# Patient Record
Sex: Female | Born: 2000 | Race: White | Hispanic: No | Marital: Single | Smoking: Never smoker
Health system: Southern US, Community
[De-identification: ages and names within clinical notes are randomized; demographics above are authoritative.]

## PROBLEM LIST (undated history)

## (undated) DIAGNOSIS — F909 Attention-deficit hyperactivity disorder, unspecified type: Secondary | ICD-10-CM

## (undated) HISTORY — PX: NO PAST SURGERIES: SHX2092

## (undated) HISTORY — DX: Attention-deficit hyperactivity disorder, unspecified type: F90.9

---
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
## 2019-03-05 ENCOUNTER — Other Ambulatory Visit: Payer: Self-pay | Admitting: Family Medicine

## 2019-03-05 DIAGNOSIS — F900 Attention-deficit hyperactivity disorder, predominantly inattentive type: Secondary | ICD-10-CM

## 2019-03-26 ENCOUNTER — Other Ambulatory Visit: Payer: Self-pay | Admitting: Nurse Practitioner

## 2019-03-26 DIAGNOSIS — N946 Dysmenorrhea, unspecified: Secondary | ICD-10-CM

## 2019-03-28 ENCOUNTER — Other Ambulatory Visit: Payer: Self-pay | Admitting: Nurse Practitioner

## 2019-03-28 DIAGNOSIS — N946 Dysmenorrhea, unspecified: Secondary | ICD-10-CM

## 2019-04-27 ENCOUNTER — Other Ambulatory Visit: Payer: Self-pay | Admitting: Family Medicine

## 2019-04-27 DIAGNOSIS — F900 Attention-deficit hyperactivity disorder, predominantly inattentive type: Secondary | ICD-10-CM

## 2019-06-15 ENCOUNTER — Other Ambulatory Visit: Payer: Self-pay | Admitting: Family Medicine

## 2019-06-15 DIAGNOSIS — N946 Dysmenorrhea, unspecified: Secondary | ICD-10-CM

## 2019-07-01 ENCOUNTER — Other Ambulatory Visit: Payer: Self-pay | Admitting: Family Medicine

## 2019-07-01 DIAGNOSIS — F900 Attention-deficit hyperactivity disorder, predominantly inattentive type: Secondary | ICD-10-CM

## 2019-07-11 ENCOUNTER — Other Ambulatory Visit: Payer: Self-pay | Admitting: Family Medicine

## 2019-07-11 DIAGNOSIS — F900 Attention-deficit hyperactivity disorder, predominantly inattentive type: Secondary | ICD-10-CM

## 2019-07-11 NOTE — Telephone Encounter (Signed)
Requested medication (s) are due for refill today: yes  Requested medication (s) are on the active medication list: yes  Last refill: 04/27/19  needs appointment for refills  Future visit scheduled:No  Notes to clinic:  Attempted to contact patient at number on chart. Did not leave message. Person on machine was not the patient. Used a different name. Medication is not delegated.    Requested Prescriptions  Pending Prescriptions Disp Refills   amphetamine-dextroamphetamine (ADDERALL) 10 MG tablet [Pharmacy Med Name: AMPHETAMINE-DEXTROAMPHETAMINE 10 MG] 45 tablet     Sig: TAKE 1 TABLET EVERY DAY WITH BREAKFAST AND 1/2 TABLET EVERY AFTERNOON WITH FOOD      Not Delegated - Psychiatry:  Stimulants/ADHD Failed - 07/11/2019  4:33 PM      Failed - This refill cannot be delegated      Failed - Urine Drug Screen completed in last 360 days.      Failed - Valid encounter within last 3 months    Recent Outpatient Visits           10 months ago Attention deficit hyperactivity disorder (ADHD), predominantly inattentive type   [facility] Smitty Cords, DO   1 year ago Attention deficit hyperactivity disorder (ADHD), predominantly inattentive type   [facility] Galen Manila, NP   1 year ago Acute periumbilical pain   [facility] Smitty Cords, DO   1 year ago Attention deficit hyperactivity disorder (ADHD), predominantly inattentive type   [facility] Galen Manila, NP   2 years ago Dysmenorrhea   [facility] Rudd, Alison Stalling, NP

## 2019-09-14 ENCOUNTER — Encounter: Payer: Self-pay | Admitting: Family Medicine

## 2019-09-14 ENCOUNTER — Ambulatory Visit (INDEPENDENT_AMBULATORY_CARE_PROVIDER_SITE_OTHER): Payer: Self-pay | Admitting: Family Medicine

## 2019-09-14 ENCOUNTER — Other Ambulatory Visit: Payer: Self-pay

## 2019-09-14 DIAGNOSIS — F900 Attention-deficit hyperactivity disorder, predominantly inattentive type: Secondary | ICD-10-CM

## 2019-09-14 MED ORDER — AMPHETAMINE-DEXTROAMPHETAMINE 10 MG PO TABS: 10.0000 mg | ORAL_TABLET | Freq: Every day | ORAL | 0 refills | Status: DC

## 2019-09-14 NOTE — Assessment & Plan Note (Signed)
Presently unmedicated, but reports is stable when taking adderall 10mg  once daily.  Had taken the 5mg  in the afternoons, but has found that taking just the one dose has been adequate.  Plan: 1. Continue adderall 10mg  once daily. 2. Follow up in 3 months

## 2019-09-14 NOTE — Patient Instructions (Signed)
Your medication refills have been sent to your pharmacy on file.  We will plan to see you back in 3 months for follow up on your ADHD  You will receive a survey after today's visit either digitally by e-mail or paper by USPS mail. Your experiences and feedback matter to [place].  Please respond so we know how we are doing as we provide care for you.  Call us with any questions/concerns/needs.  It is my goal to be available to you for your health concerns.  Thanks for choosing me to be a partner in your healthcare needs!  Charlaine Dalton, FNP-C Family Nurse Practitioner [facility] Phone: [phone]

## 2019-09-14 NOTE — Progress Notes (Signed)
Subjective:    Patient ID: Kim Patterson, female    DOB: 25-Jul-2000, 19 y.o.   MRN: [id number]  Kim Patterson is a 19 y.o. female presenting on 09/14/2019 for ADHD (pt need medication refills )   HPI  Ms. Falk presents to clinic for medication refills.  Reports that she had been taking Adderall 10mg  with breakfast and 5mg  in the afternoons, has run out of her prescription but did find that she was seeing success with taking just 10mg  with breakfast.  Is interested in restarting medication.  Denies any other acute concerns today.  Depression screen [facility] 2/9 09/14/2019 02/09/2018  Decreased Interest 0 0  Down, Depressed, Hopeless 0 0  PHQ - 2 Score 0 0    Social History   Tobacco Use  . Smoking status: Never Smoker  . Smokeless tobacco: Never Used  Vaping Use  . Vaping Use: Never used  Substance Use Topics  . Alcohol use: No  . Drug use: No    Review of Systems  Constitutional: Negative.   HENT: Negative.   Eyes: Negative.   Respiratory: Negative.   Cardiovascular: Negative.   Gastrointestinal: Negative.   Endocrine: Negative.   Genitourinary: Negative.   Musculoskeletal: Negative.   Skin: Negative.   Allergic/Immunologic: Negative.   Neurological: Negative.   Hematological: Negative.   Psychiatric/Behavioral: Negative for agitation, behavioral problems, confusion, decreased concentration, dysphoric mood, hallucinations, self-injury, sleep disturbance and suicidal ideas. The patient is hyperactive. The patient is not nervous/anxious.    Per HPI unless specifically indicated above     Objective:    BP 124/86 (BP Location: Left Arm, Patient Position: Sitting, Cuff Size: Normal)   Pulse 72   Temp (!) 97.1 F (36.2 C) (Temporal)   Ht 5' 6.5" (1.689 m)   Wt 203 lb 9.6 oz (92.4 kg)   LMP 09/14/2019   BMI 32.37 kg/m   Wt Readings from Last 3 Encounters:  09/14/19 203 lb 9.6 oz (92.4 kg) (98 %, Z= 1.99)*  03/12/18 154 lb 6.4 oz (70 kg) (86 %, Z= 1.09)*  02/10/18 152  lb (68.9 kg) (85 %, Z= 1.03)*   * Growth percentiles are based on CDC (Girls, 2-20 Years) data.    Physical Exam Vitals reviewed.  Constitutional:      General: She is not in acute distress.    Appearance: Normal appearance. She is well-developed and well-groomed. She is obese. She is not ill-appearing or toxic-appearing.  HENT:     Head: Normocephalic and atraumatic.     Nose:     Comments: 09/16/19 is in place, covering mouth and nose. Eyes:     General: Lids are normal. Vision grossly intact.        Right eye: No discharge.        Left eye: No discharge.     Extraocular Movements: Extraocular movements intact.     Conjunctiva/sclera: Conjunctivae normal.     Pupils: Pupils are equal, round, and reactive to light.  Cardiovascular:     Rate and Rhythm: Normal rate and regular rhythm.     Pulses: Normal pulses.          Dorsalis pedis pulses are 2+ on the right side and 2+ on the left side.     Heart sounds: Normal heart sounds. No murmur heard.  No friction rub. No gallop.   Pulmonary:     Effort: Pulmonary effort is normal. No respiratory distress.     Breath sounds: Normal breath sounds.  Musculoskeletal:     Right lower leg: No edema.     Left lower leg: No edema.  Skin:    General: Skin is warm and dry.     Capillary Refill: Capillary refill takes less than 2 seconds.  Neurological:     General: No focal deficit present.     Mental Status: She is alert and oriented to person, place, and time.  Psychiatric:        Attention and Perception: Attention and perception normal.        Mood and Affect: Mood and affect normal.        Speech: Speech normal.        Behavior: Behavior is hyperactive. Behavior is cooperative.        Thought Content: Thought content normal.        Cognition and Memory: Cognition and memory normal.        Judgment: Judgment normal.    Results for orders placed or performed in visit on 02/09/18  CBC with Differential/Platelet  Result Value Ref  Range   WBC 14.4 (H) 4.5 - 13.0 Thousand/uL   RBC 5.13 (H) 3.80 - 5.10 Million/uL   Hemoglobin 16.0 (H) 11.5 - 15.3 g/dL   HCT 48.1 (H) 34 - 46 %   MCV 93.8 78.0 - 98.0 fL   MCH 31.2 25.0 - 35.0 pg   MCHC 33.3 31.0 - 36.0 g/dL   RDW 11.7 11.0 - 15.0 %   Platelets 509 (H) 140 - 400 Thousand/uL   MPV 10.7 7.5 - 12.5 fL   Neutro Abs 8,957 (H) 1,800 - 8,000 cells/uL   Lymphs Abs 3,456 1,200 - 5,200 cells/uL   WBC mixed population 936 (H) 200 - 900 cells/uL   Eosinophils Absolute 1,008 (H) 15 - 500 cells/uL   Basophils Absolute 43 0 - 200 cells/uL   Neutrophils Relative % 62.2 %   Total Lymphocyte 24.0 %   Monocytes Relative 6.5 %   Eosinophils Relative 7.0 %   Basophils Relative 0.3 %  COMPLETE METABOLIC PANEL WITH GFR  Result Value Ref Range   Glucose, Bld 104 (H) 65 - 99 mg/dL   BUN 9 7 - 20 mg/dL   Creat 0.84 0.50 - 1.00 mg/dL   GFR, Est Non African American 101 > OR = 60 mL/min/1.36m2   GFR, Est African American 118 > OR = 60 mL/min/1.20m2   BUN/Creatinine Ratio NOT APPLICABLE 6 - 22 (calc)   Sodium 137 135 - 146 mmol/L   Potassium 5.0 3.8 - 5.1 mmol/L   Chloride 101 98 - 110 mmol/L   CO2 28 20 - 32 mmol/L   Calcium 10.6 (H) 8.9 - 10.4 mg/dL   Total Protein 7.7 6.3 - 8.2 g/dL   Albumin 5.1 3.6 - 5.1 g/dL   Globulin 2.6 2.0 - 3.8 g/dL (calc)   AG Ratio 2.0 1.0 - 2.5 (calc)   Total Bilirubin 0.8 0.2 - 1.1 mg/dL   Alkaline phosphatase (APISO) 55 47 - 176 U/L   AST 14 12 - 32 U/L   ALT 17 5 - 32 U/L  POCT urinalysis dipstick  Result Value Ref Range   Color, UA clear    Clarity, UA clear    Glucose, UA Negative Negative   Bilirubin, UA neg    Ketones, UA neg    Spec Grav, UA 1.010 1.010 - 1.025   Blood, UA neg    pH, UA 5.0 5.0 - 8.0   Protein, UA Negative Negative  Urobilinogen, UA 0.2 0.2 or 1.0 E.U./dL   Nitrite, UA neg    Leukocytes, UA Negative Negative   Appearance normal    Odor neg   POCT urine pregnancy  Result Value Ref Range   Preg Test, Ur Negative  Negative      Assessment & Plan:   Problem List Items Addressed This Visit      Other   Attention deficit hyperactivity disorder (ADHD), predominantly inattentive type    Presently unmedicated, but reports is stable when taking adderall 10mg  once daily.  Had taken the 5mg  in the afternoons, but has found that taking just the one dose has been adequate.  Plan: 1. Continue adderall 10mg  once daily. 2. Follow up in 3 months      Relevant Medications   amphetamine-dextroamphetamine (ADDERALL) 10 MG tablet      Meds ordered this encounter  Medications  . amphetamine-dextroamphetamine (ADDERALL) 10 MG tablet    Sig: Take 1 tablet (10 mg total) by mouth daily with breakfast.    Dispense:  30 tablet    Refill:  0      Follow up plan: Return in about 3 months (around 12/15/2019) for ADHD F/U.   , FNP Family Nurse Practitioner [facility] 09/14/2019, 11:49 AM

## 2019-09-15 IMAGING — CT CT ABD-PELV W/ CM
2 of 4 series · 17 of 46 positions shown, 19 images · IV contrast (APPLIED)
Comparison: 08/14/2013

CLINICAL DATA: Intermittent RIGHT-sided abdominal pain with nausea
and vomiting.

EXAM:
CT ABDOMEN AND PELVIS WITH CONTRAST
TECHNIQUE: Multidetector CT imaging of the abdomen and pelvis was performed
using the standard protocol following bolus administration of
intravenous contrast.
CONTRAST:  100mL GKVGT5-Y88 IOPAMIDOL (GKVGT5-Y88) INJECTION 61%

[Series 2: routine abd/pel with · axial · 0.74mm/px · z∈[-504,-104]mm · 14 of 88 slices shown, 16 images]
[im 4/88  soft-tissue]
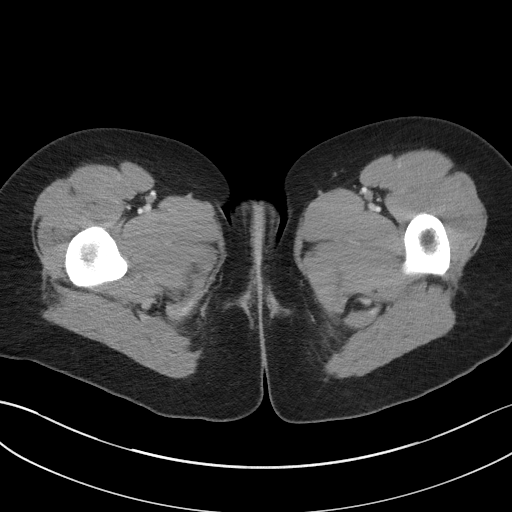
[im 4/88  bone]
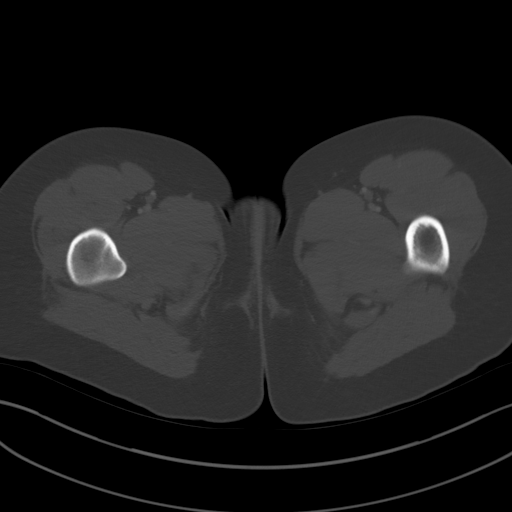
[im 11/88  soft-tissue]
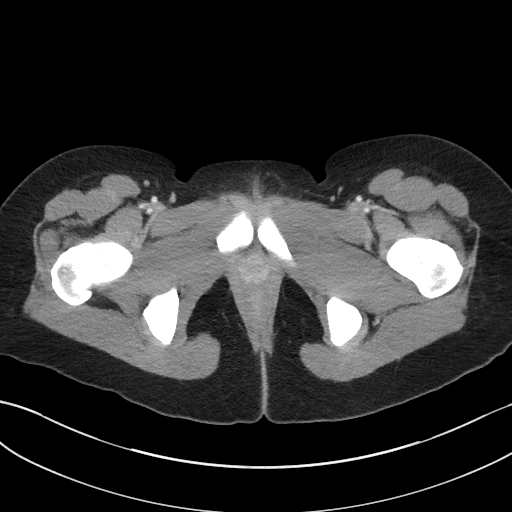
[im 18/88  soft-tissue]
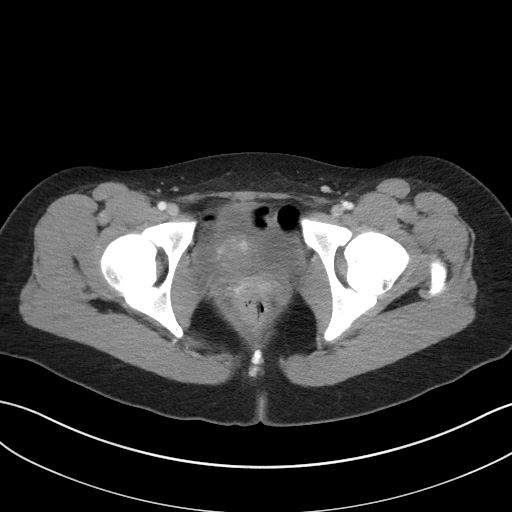
[im 25/88  soft-tissue]
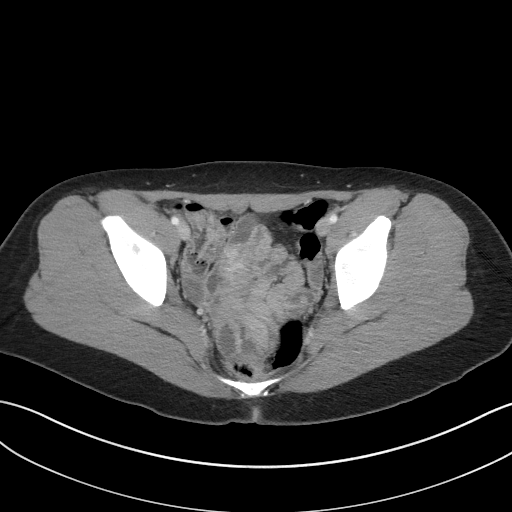
[im 28/88  soft-tissue]
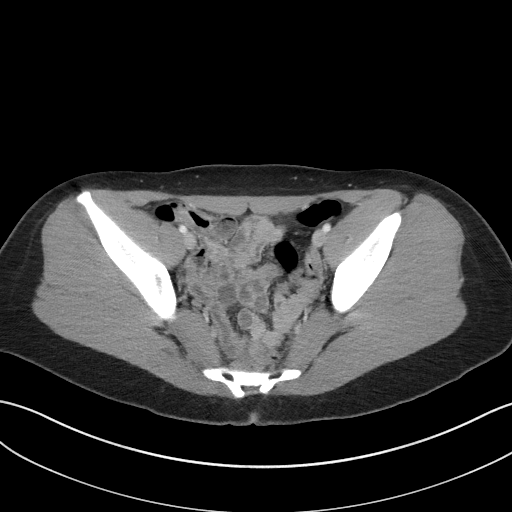
[im 35/88  soft-tissue]
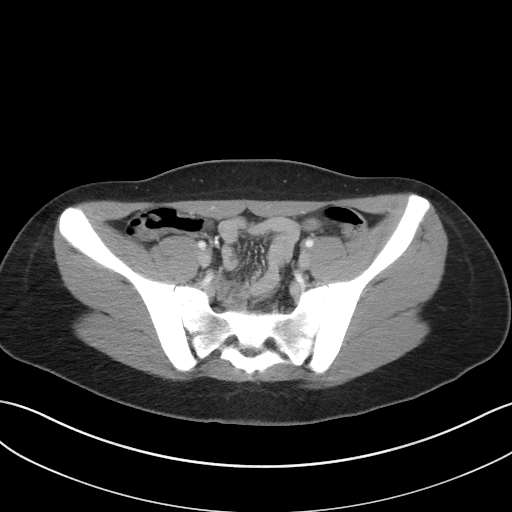
[im 42/88  soft-tissue]
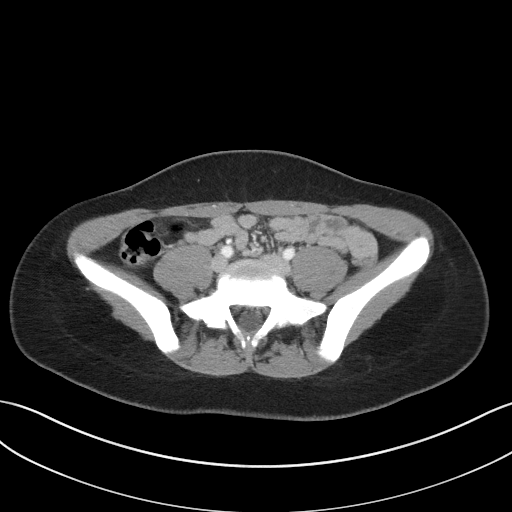
[im 46/88  soft-tissue]
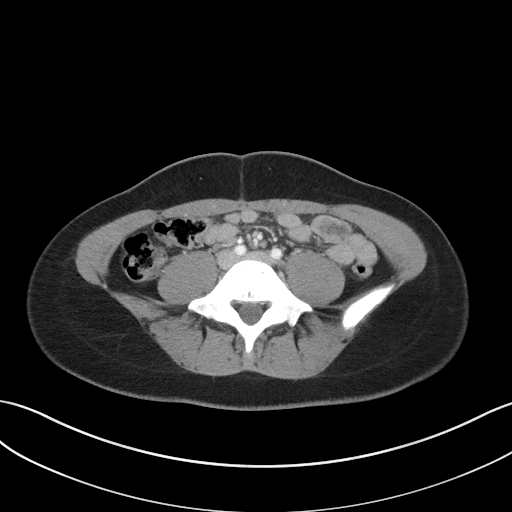
[im 53/88  soft-tissue]
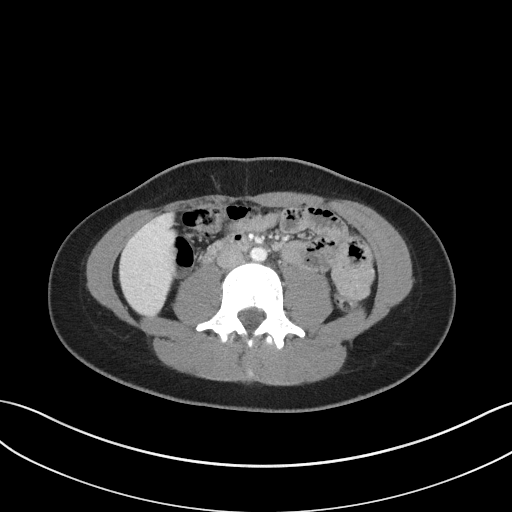
[im 53/88  bone]
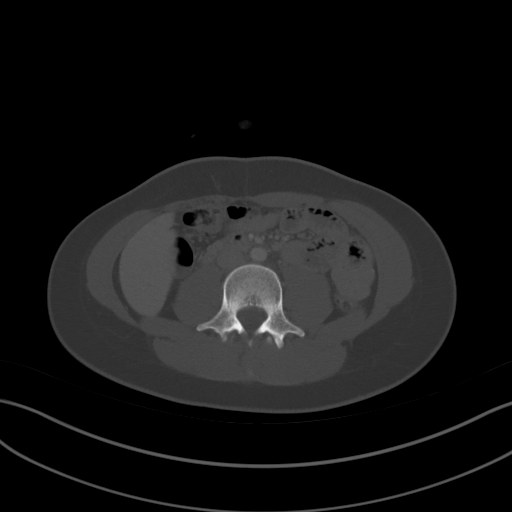
[im 60/88  soft-tissue]
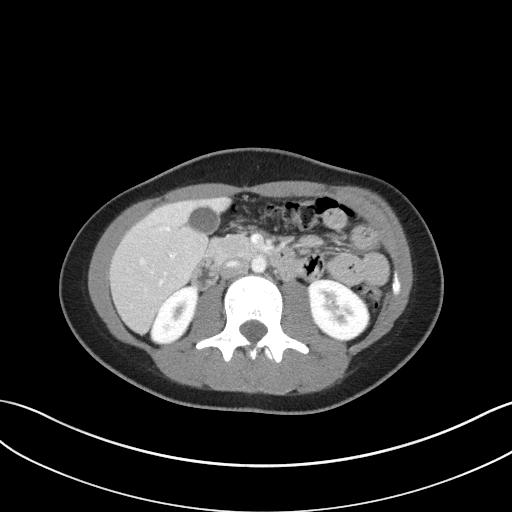
[im 67/88  soft-tissue]
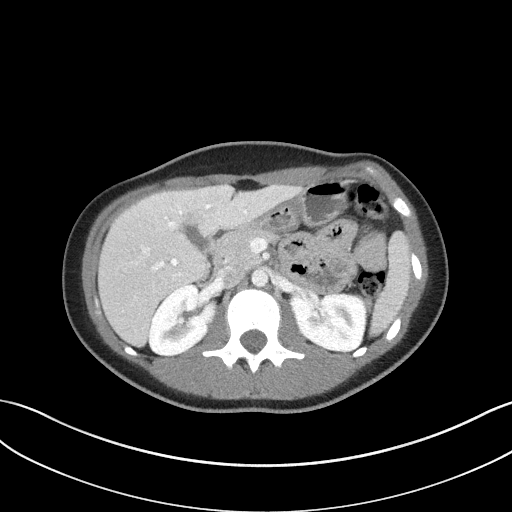
[im 70/88  soft-tissue]
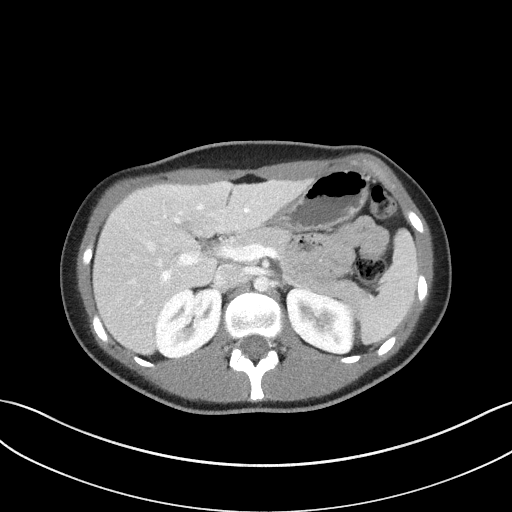
[im 77/88  soft-tissue]
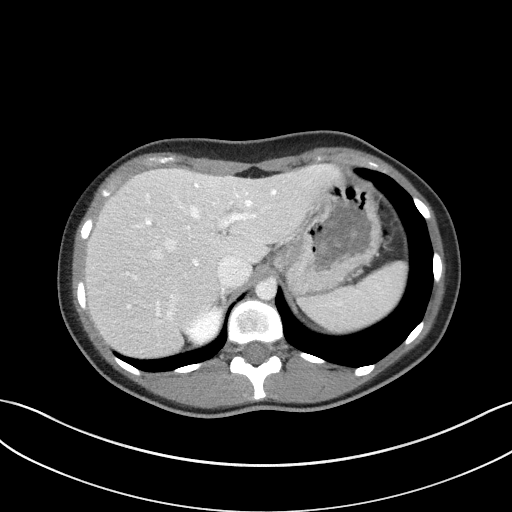
[im 84/88  soft-tissue]
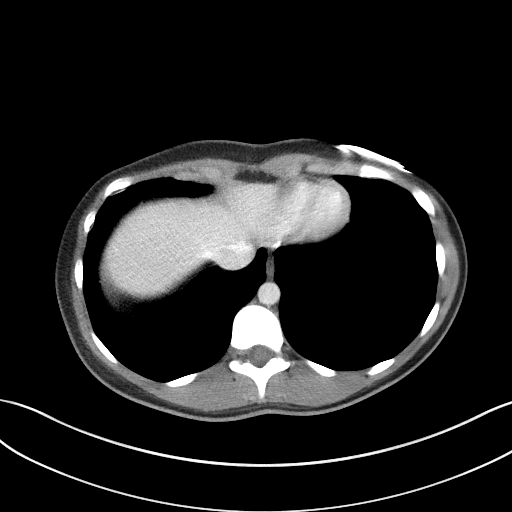

[Series 5: coronal st · coronal · 0.85mm/px · 3 of 69 slices shown]
[im 23/69  soft-tissue]
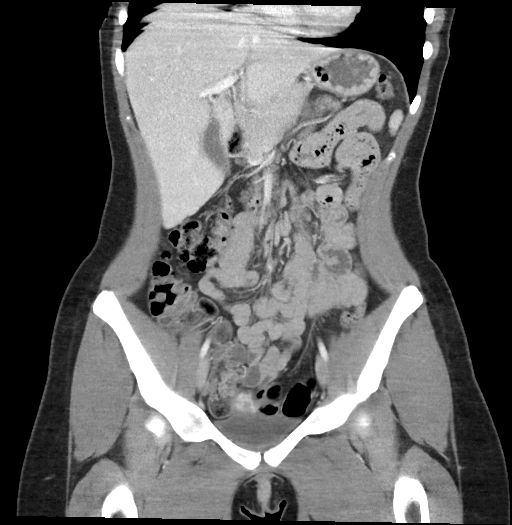
[im 31/69  soft-tissue]
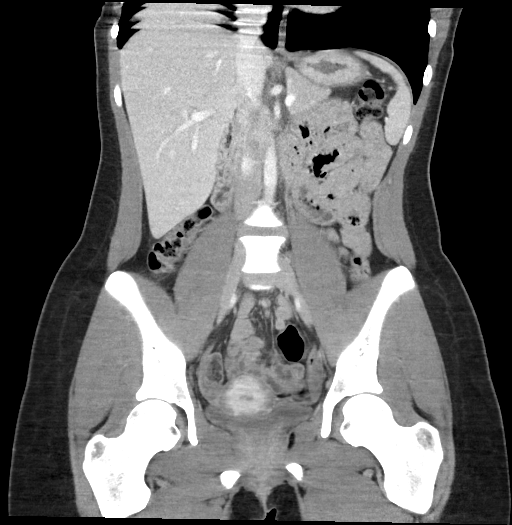
[im 38/69  soft-tissue]
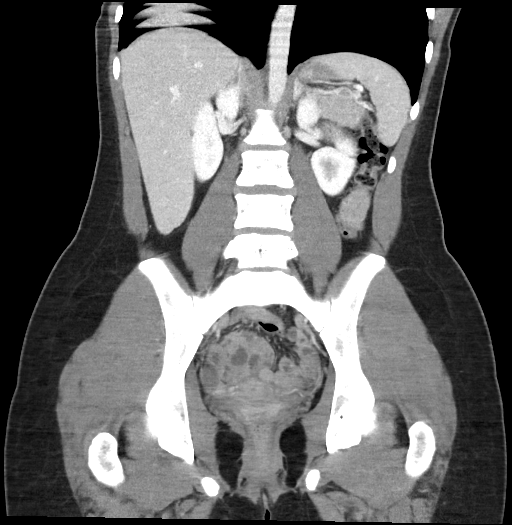

[17 of 46 positions shown; findings below may reference images not displayed]

FINDINGS: Lower chest: No acute abnormality.

Hepatobiliary: No focal liver abnormality is seen. No gallstones,
gallbladder wall thickening, or biliary dilatation.

Pancreas: Unremarkable. No pancreatic ductal dilatation or
surrounding inflammatory changes.

Spleen: Normal in size without focal abnormality.

Adrenals/Urinary Tract: Adrenal glands are unremarkable. Kidneys are
normal, without renal calculi, focal lesion, or hydronephrosis.
Bladder is unremarkable.

Stomach/Bowel: Stomach is within normal limits. Appendix appears
normal, see coronal series 5, image 34. No evidence of bowel wall
thickening, distention, or inflammatory changes.

Vascular/Lymphatic: No significant vascular findings. Mildly
prominent mesenteric nodes in the RIGHT lower quadrant query
mesenteric adenitis.

Reproductive: Uterus and bilateral adnexa are unremarkable.

Other: No abdominal wall hernia or abnormality. No abdominopelvic
ascites.

Musculoskeletal: No acute or significant osseous findings.
IMPRESSION: 1. Mildly prominent mesenteric nodes in the RIGHT lower quadrant,
query mesenteric adenitis.
2. Otherwise negative exam. Normal appendix. No evidence for
appendicitis.

## 2019-09-16 ENCOUNTER — Other Ambulatory Visit: Payer: Self-pay | Admitting: Family Medicine

## 2019-09-16 DIAGNOSIS — N946 Dysmenorrhea, unspecified: Secondary | ICD-10-CM

## 2019-09-16 NOTE — Telephone Encounter (Signed)
Requested  medications are  due for refill today yes  Requested medications are on the active medication list yes  Last refill 4/9   Notes to clinic Dr Charm Rings note states no refills until seen in office again. Saw Dr Anitra Lauth 6/23 but med nor condition were mentioned.

## 2019-10-31 ENCOUNTER — Other Ambulatory Visit: Payer: Self-pay | Admitting: Family Medicine

## 2019-10-31 DIAGNOSIS — F900 Attention-deficit hyperactivity disorder, predominantly inattentive type: Secondary | ICD-10-CM

## 2019-10-31 NOTE — Telephone Encounter (Signed)
Requested medication (s) are due for refill today -yes  Requested medication (s) are on the active medication list -yes  Future visit scheduled -no  Last refill: 09/14/19  Notes to clinic: Request for non delegated Rx  Requested Prescriptions  Pending Prescriptions Disp Refills   amphetamine-dextroamphetamine (ADDERALL) 10 MG tablet [Pharmacy Med Name: AMPHETAMINE-DEXTROAMPHETAMINE 10 MG] 30 tablet     Sig: TAKE 1 TABLET BY MOUTH DAILY WITH BREAKFAST      Not Delegated - Psychiatry:  Stimulants/ADHD Failed - 10/31/2019 11:50 AM      Failed - This refill cannot be delegated      Failed - Urine Drug Screen completed in last 360 days.      Passed - Valid encounter within last 3 months    Recent Outpatient Visits           1 month ago Attention deficit hyperactivity disorder (ADHD), predominantly inattentive type   [facility], Jodelle Gross, FNP   1 year ago Attention deficit hyperactivity disorder (ADHD), predominantly inattentive type   [facility] Smitty Cords, DO   1 year ago Attention deficit hyperactivity disorder (ADHD), predominantly inattentive type   [facility] Galen Manila, NP   1 year ago Acute periumbilical pain   [facility], Netta Neat, DO   2 years ago Attention deficit hyperactivity disorder (ADHD), predominantly inattentive type   [facility] Kyung Rudd, Alison Stalling, NP                  Requested Prescriptions  Pending Prescriptions Disp Refills   amphetamine-dextroamphetamine (ADDERALL) 10 MG tablet [Pharmacy Med Name: AMPHETAMINE-DEXTROAMPHETAMINE 10 MG] 30 tablet     Sig: TAKE 1 TABLET BY MOUTH DAILY WITH BREAKFAST      Not Delegated - Psychiatry:  Stimulants/ADHD Failed - 10/31/2019 11:50 AM      Failed - This refill cannot be delegated      Failed - Urine Drug Screen completed in last 360 days.      Passed - Valid encounter within  last 3 months    Recent Outpatient Visits           1 month ago Attention deficit hyperactivity disorder (ADHD), predominantly inattentive type   [facility], Jodelle Gross, FNP   1 year ago Attention deficit hyperactivity disorder (ADHD), predominantly inattentive type   [facility] Smitty Cords, DO   1 year ago Attention deficit hyperactivity disorder (ADHD), predominantly inattentive type   [facility] Galen Manila, NP   1 year ago Acute periumbilical pain   [facility] [place], Netta Neat, DO   2 years ago Attention deficit hyperactivity disorder (ADHD), predominantly inattentive type   [facility] Galen Manila, NP

## 2019-11-01 NOTE — Telephone Encounter (Signed)
[place] 11[place]01/19.  According to PMP aware, pt last received a refill on 09/14/2019.  Refill of her controlled substance will be provided today.

## 2019-11-18 ENCOUNTER — Other Ambulatory Visit: Payer: Self-pay | Admitting: Family Medicine

## 2019-11-18 DIAGNOSIS — N946 Dysmenorrhea, unspecified: Secondary | ICD-10-CM

## 2019-11-18 NOTE — Telephone Encounter (Signed)
Requested Prescriptions  Pending Prescriptions Disp Refills  . BLISOVI FE 1/20 1-20 MG-MCG tablet [Pharmacy Med Name: BLISOVI FE 1/20 1-20 MG-MCG TAB] 28 tablet 2    Sig: TAKE 1 TABLET BY MOUTH DAILY     OB/GYN:  Contraceptives Passed - 11/18/2019  2:50 PM      Passed - Last BP in normal range    BP Readings from Last 1 Encounters:  09/14/19 124/86         Passed - Valid encounter within last 12 months    Recent Outpatient Visits          2 months ago Attention deficit hyperactivity disorder (ADHD), predominantly inattentive type   [facility], Jodelle Gross, FNP   1 year ago Attention deficit hyperactivity disorder (ADHD), predominantly inattentive type   [facility] Smitty Cords, DO   1 year ago Attention deficit hyperactivity disorder (ADHD), predominantly inattentive type   [facility] Galen Manila, NP   1 year ago Acute periumbilical pain   [facility] [place], Netta Neat, DO   2 years ago Attention deficit hyperactivity disorder (ADHD), predominantly inattentive type   [facility] Kyung Rudd, Alison Stalling, NP

## 2019-12-14 ENCOUNTER — Other Ambulatory Visit: Payer: Self-pay

## 2019-12-14 ENCOUNTER — Ambulatory Visit
Admission: EM | Admit: 2019-12-14 | Discharge: 2019-12-14 | Disposition: A | Discharge status: Home / Self Care | Payer: Self-pay | Attending: Family Medicine | Admitting: Family Medicine

## 2019-12-14 DIAGNOSIS — R5383 Other fatigue: Secondary | ICD-10-CM

## 2019-12-14 DIAGNOSIS — M546 Pain in thoracic spine: Secondary | ICD-10-CM

## 2019-12-14 DIAGNOSIS — R0789 Other chest pain: Secondary | ICD-10-CM

## 2019-12-14 DIAGNOSIS — Z3202 Encounter for pregnancy test, result negative: Secondary | ICD-10-CM

## 2019-12-14 DIAGNOSIS — J209 Acute bronchitis, unspecified: Secondary | ICD-10-CM

## 2019-12-14 LAB — POCT URINE PREGNANCY: Preg Test, Ur: NEGATIVE

## 2019-12-14 MED ORDER — ALBUTEROL SULFATE HFA 108 (90 BASE) MCG/ACT IN AERS: 2.0000 | INHALATION_SPRAY | RESPIRATORY_TRACT | 0 refills | Status: DC | PRN

## 2019-12-14 MED ORDER — PREDNISONE 10 MG (21) PO TBPK: ORAL_TABLET | Freq: Every day | ORAL | 0 refills | Status: AC

## 2019-12-14 NOTE — ED Provider Notes (Signed)
[facility]   [id number] 12/14/19 Arrival Time: 1548   SUBJECTIVE:  Kim Patterson is a 19 y.o. female who presents with complaint of fatigue, SOB, chest tightness, headache. Onset abrupt approximately 1 week ago. Overall fatigued. SOB: mild. Wheezing: mild. Has negative history of Covid. Has not completed Covid vaccines. OTC treatment: Motrin. Social History   Tobacco Use  Smoking Status Never Smoker  Smokeless Tobacco Never Used    ROS: As per HPI.   OBJECTIVE:  Vitals:   12/14/19 1601  BP: 124/90  Pulse: 98  Resp: 18  Temp: 98.8 F (37.1 C)  TempSrc: Oral  SpO2: 99%     General appearance: alert; no distress HEENT: nasal congestion; clear runny nose; throat irritation secondary to post-nasal drainage Neck: supple without LAD Lungs: wheezing to bilateral lower lobes, otherwise CTA Skin: warm and dry Psychological: alert and cooperative; normal mood and affect  Results for orders placed or performed in visit on 02/09/18  CBC with Differential/Platelet  Result Value Ref Range   WBC 14.4 (H) 4.5 - 13.0 Thousand/uL   RBC 5.13 (H) 3.80 - 5.10 Million/uL   Hemoglobin 16.0 (H) 11.5 - 15.3 g/dL   HCT 76.1 (H) 34 - 46 %   MCV 93.8 78.0 - 98.0 fL   MCH 31.2 25.0 - 35.0 pg   MCHC 33.3 31.0 - 36.0 g/dL   RDW 60.7 37.1 - 06.2 %   Platelets 509 (H) 140 - 400 Thousand/uL   MPV 10.7 7.5 - 12.5 fL   Neutro Abs 8,957 (H) 1,800 - 8,000 cells/uL   Lymphs Abs 3,456 1,200 - 5,200 cells/uL   WBC mixed population 936 (H) 200 - 900 cells/uL   Eosinophils Absolute 1,008 (H) 15 - 500 cells/uL   Basophils Absolute 43 0 - 200 cells/uL   Neutrophils Relative % 62.2 %   Total Lymphocyte 24.0 %   Monocytes Relative 6.5 %   Eosinophils Relative 7.0 %   Basophils Relative 0.3 %  COMPLETE METABOLIC PANEL WITH GFR  Result Value Ref Range   Glucose, Bld 104 (H) 65 - 99 mg/dL   BUN 9 7 - 20 mg/dL   Creat 6.94 8.54 - 6.27 mg/dL   GFR, Est Non African American 101 > OR = 60  mL/min/1.87m2   GFR, Est African American 118 > OR = 60 mL/min/1.75m2   BUN/Creatinine Ratio NOT APPLICABLE 6 - 22 (calc)   Sodium 137 135 - 146 mmol/L   Potassium 5.0 3.8 - 5.1 mmol/L   Chloride 101 98 - 110 mmol/L   CO2 28 20 - 32 mmol/L   Calcium 10.6 (H) 8.9 - 10.4 mg/dL   Total Protein 7.7 6.3 - 8.2 g/dL   Albumin 5.1 3.6 - 5.1 g/dL   Globulin 2.6 2.0 - 3.8 g/dL (calc)   AG Ratio 2.0 1.0 - 2.5 (calc)   Total Bilirubin 0.8 0.2 - 1.1 mg/dL   Alkaline phosphatase (APISO) 55 47 - 176 U/L   AST 14 12 - 32 U/L   ALT 17 5 - 32 U/L  POCT urinalysis dipstick  Result Value Ref Range   Color, UA clear    Clarity, UA clear    Glucose, UA Negative Negative   Bilirubin, UA neg    Ketones, UA neg    Spec Grav, UA 1.010 1.010 - 1.025   Blood, UA neg    pH, UA 5.0 5.0 - 8.0   Protein, UA Negative Negative   Urobilinogen, UA 0.2 0.2 or 1.0  E.U./dL   Nitrite, UA neg    Leukocytes, UA Negative Negative   Appearance normal    Odor neg   POCT urine pregnancy  Result Value Ref Range   Preg Test, Ur Negative Negative    Labs Reviewed  POCT URINE PREGNANCY    No results found.  No Known Allergies  Past Medical History:  Diagnosis Date  . ADHD (attention deficit hyperactivity disorder)    Social History   Socioeconomic History  . Marital status: Single    Spouse name: Not on file  . Number of children: Not on file  . Years of education: Not on file  . Highest education level: Not on file  Occupational History  . Not on file  Tobacco Use  . Smoking status: Never Smoker  . Smokeless tobacco: Never Used  Vaping Use  . Vaping Use: Never used  Substance and Sexual Activity  . Alcohol use: No  . Drug use: No  . Sexual activity: Not on file  Other Topics Concern  . Not on file  Social History Narrative  . Not on file   Social Determinants of Health   Financial Resource Strain:   . Difficulty of Paying Living Expenses: Not on file  Food Insecurity:   . Worried About  Programme researcher, broadcasting/film/video in the Last Year: Not on file  . Ran Out of Food in the Last Year: Not on file  Transportation Needs:   . Lack of Transportation (Medical): Not on file  . Lack of Transportation (Non-Medical): Not on file  Physical Activity:   . Days of Exercise per Week: Not on file  . Minutes of Exercise per Session: Not on file  Stress:   . Feeling of Stress : Not on file  Social Connections:   . Frequency of Communication with Friends and Family: Not on file  . Frequency of Social Gatherings with Friends and Family: Not on file  . Attends Religious Services: Not on file  . Active Member of Clubs or Organizations: Not on file  . Attends Banker Meetings: Not on file  . Marital Status: Not on file  Intimate Partner Violence:   . Fear of Current or Ex-Partner: Not on file  . Emotionally Abused: Not on file  . Physically Abused: Not on file  . Sexually Abused: Not on file   Family History  Problem Relation Age of Onset  . Healthy Mother   . Hypertension Father   . Healthy Sister   . Irritable bowel syndrome Maternal Aunt   . Irritable bowel syndrome Maternal Grandmother   . Hypertension Paternal Grandmother   . Endometriosis Maternal Aunt   . Stroke Neg Hx   . Heart disease Neg Hx   . Breast cancer Neg Hx   . Ovarian cancer Neg Hx   . Colon cancer Neg Hx      ASSESSMENT & PLAN:  1. Acute bronchitis, unspecified organism   2. Other fatigue   3. Chest tightness   4. Acute bilateral thoracic back pain   5. Negative pregnancy test     Meds ordered this encounter  Medications  . albuterol (VENTOLIN HFA) 108 (90 Base) MCG/ACT inhaler    Sig: Inhale 2 puffs into the lungs every 4 (four) hours as needed for wheezing or shortness of breath.    Dispense:  18 g    Refill:  0    Order Specific Question:   Supervising Provider    Answer:   LAMPTEY,  PHILIP O [id number]  . predniSONE (STERAPRED UNI-PAK 21 TAB) 10 MG (21) TBPK tablet    Sig: Take by mouth  daily for 6 days. Take 6 tablets on day 1, 5 tablets on day 2, 4 tablets on day 3, 3 tablets on day 4, 2 tablets on day 5, 1 tablet on day 6    Dispense:  21 tablet    Refill:  0    Order Specific Question:   Supervising Provider    Answer:   Merrilee Jansky [id number]    Steroid taper prescribed Albuterol inhaler prescribed Negative pregnancy test in office today OTC symptom care as needed. Will plan f/u with PCP or here as needed.  Reviewed expectations re: course of current medical issues. Questions answered. Outlined signs and symptoms indicating need for more acute intervention. Patient verbalized understanding. After Visit Summary given.           Moshe Cipro, NP 12/14/19 1629

## 2019-12-14 NOTE — Discharge Instructions (Signed)
I have sent in a prednisone taper for you to take for 6 days. 6 tablets on day one, 5 tablets on day two, 4 tablets on day three, 3 tablets on day four, 2 tablets on day five, and 1 tablet on day six.  I have also sent in an albuterol inhaler for you to use 2 puffs every 4-6 hours as needed for chest tightness, wheezing, shortness of breath  Follow up with this office or with primary care if symptoms are persisting  Follow up with the ER for trouble swallowing, trouble breathing, other concerning symptoms

## 2019-12-14 NOTE — ED Triage Notes (Addendum)
Pt is here with hypotension and a muscle strain that started a week ago, pt has taken Advil to relieve discomfort.

## 2020-01-19 ENCOUNTER — Other Ambulatory Visit: Payer: Self-pay | Admitting: Family Medicine

## 2020-01-19 DIAGNOSIS — F900 Attention-deficit hyperactivity disorder, predominantly inattentive type: Secondary | ICD-10-CM

## 2020-01-19 NOTE — Telephone Encounter (Signed)
[place]ally [place] today, 01/19/20.  According to PMP aware, pt last received a refill on 11/01/2019.  Refill of her controlled substance will be provided today.

## 2020-01-19 NOTE — Telephone Encounter (Signed)
Requested medication (s) are due for refill today - yes  Requested medication (s) are on the active medication list -yes  Future visit scheduled -no  Last refill: 11/01/19  Notes to clinic: Request non delegated Rx  Requested Prescriptions  Pending Prescriptions Disp Refills   amphetamine-dextroamphetamine (ADDERALL) 10 MG tablet [Pharmacy Med Name: AMPHETAMINE-DEXTROAMPHETAMINE 10 MG] 30 tablet     Sig: TAKE 1 TABLET BY MOUTH DAILY WITH BREAKFAST      Not Delegated - Psychiatry:  Stimulants/ADHD Failed - 01/19/2020  1:45 PM      Failed - This refill cannot be delegated      Failed - Urine Drug Screen completed in last 360 days.      Failed - Valid encounter within last 3 months    Recent Outpatient Visits           4 months ago Attention deficit hyperactivity disorder (ADHD), predominantly inattentive type   [facility], Jodelle Gross, FNP   1 year ago Attention deficit hyperactivity disorder (ADHD), predominantly inattentive type   [facility] Smitty Cords, DO   1 year ago Attention deficit hyperactivity disorder (ADHD), predominantly inattentive type   [facility] Galen Manila, NP   1 year ago Acute periumbilical pain   [facility], Netta Neat, DO   2 years ago Attention deficit hyperactivity disorder (ADHD), predominantly inattentive type   [facility] Ky[facility] Rudd, Alison Stalling, NP                  Requested Prescriptions  Pending Prescriptions Disp Refills   amphetamine-dextroamphetamine (ADDERALL) 10 MG tablet [Pharmacy Med Name: AMPHETAMINE-DEXTROAMPHETAMINE 10 MG] 30 tablet     Sig: TAKE 1 TABLET BY MOUTH DAILY WITH BREAKFAST      Not Delegated - Psychiatry:  Stimulants/ADHD Failed - 01/19/2020  1:45 PM      Failed - This refill cannot be delegated      Failed - Urine Drug Screen completed in last 360 days.      Failed - Valid encounter within  last 3 months    Recent Outpatient Visits           4 months ago Attention deficit hyperactivity disorder (ADHD), predominantly inattentive type   [facility], Jodelle Gross, FNP   1 year ago Attention deficit hyperactivity disorder (ADHD), predominantly inattentive type   [facility] Smitty Cords, DO   1 year ago Attention deficit hyperactivity disorder (ADHD), predominantly inattentive type   [facility] Galen Manila, NP   1 year ago Acute periumbilical pain   [facility] [place], Netta Neat, DO   2 years ago Attention deficit hyperactivity disorder (ADHD), predominantly inattentive type   [facility] Galen Manila, NP

## 2020-03-19 ENCOUNTER — Other Ambulatory Visit: Payer: Self-pay | Admitting: Family Medicine

## 2020-03-19 DIAGNOSIS — F900 Attention-deficit hyperactivity disorder, predominantly inattentive type: Secondary | ICD-10-CM

## 2020-03-19 NOTE — Telephone Encounter (Signed)
Requested medication (s) are due for refill today: yes  Requested medication (s) are on the active medication list: yes  Last refill:  01/19/20 #30 0 refills  Future visit scheduled: no   Notes to clinic:  not delegated per protocol, no valid encounter with in 3 months.     Requested Prescriptions  Pending Prescriptions Disp Refills   amphetamine-dextroamphetamine (ADDERALL) 10 MG tablet [Pharmacy Med Name: AMPHETAMINE-DEXTROAMPHETAMINE 10 MG] 30 tablet     Sig: TAKE 1 TABLET BY MOUTH DAILY WITH BREAKFAST      Not Delegated - Psychiatry:  Stimulants/ADHD Failed - 03/19/2020  2:09 PM      Failed - This refill cannot be delegated      Failed - Urine Drug Screen completed in last 360 days      Failed - Valid encounter within last 3 months    Recent Outpatient Visits           6 months ago Attention deficit hyperactivity disorder (ADHD), predominantly inattentive type   [facility], Jodelle Gross, FNP   1 year ago Attention deficit hyperactivity disorder (ADHD), predominantly inattentive type   [facility] Smitty Cords, DO   2 years ago Attention deficit hyperactivity disorder (ADHD), predominantly inattentive type   [facility] Galen Manila, NP   2 years ago Acute periumbilical pain   [facility] [place], Netta Neat, DO   2 years ago Attention deficit hyperactivity disorder (ADHD), predominantly inattentive type   [facility] Kyung Rudd, Alison Stalling, NP

## 2020-03-19 NOTE — Telephone Encounter (Signed)
Personally reviewed [place] today, 03/19/20.  According to PMP aware, pt last received a refill on 02/06/2020.  Refill of her controlled substance will be provided today.

## 2020-05-10 ENCOUNTER — Other Ambulatory Visit: Payer: Self-pay

## 2020-05-10 DIAGNOSIS — F900 Attention-deficit hyperactivity disorder, predominantly inattentive type: Secondary | ICD-10-CM

## 2020-05-11 NOTE — Telephone Encounter (Signed)
I notified the patient that she need a f/u appt before getting refills on her Adderall. Appt schedule May 23, 2020.

## 2020-05-23 ENCOUNTER — Encounter: Payer: Self-pay | Admitting: Family Medicine

## 2020-05-23 ENCOUNTER — Ambulatory Visit (INDEPENDENT_AMBULATORY_CARE_PROVIDER_SITE_OTHER): Payer: Self-pay | Admitting: Family Medicine

## 2020-05-23 ENCOUNTER — Other Ambulatory Visit: Payer: Self-pay

## 2020-05-23 DIAGNOSIS — F900 Attention-deficit hyperactivity disorder, predominantly inattentive type: Secondary | ICD-10-CM

## 2020-05-23 MED ORDER — AMPHETAMINE-DEXTROAMPHETAMINE 10 MG PO TABS: 10.0000 mg | ORAL_TABLET | Freq: Every day | ORAL | 0 refills | Status: DC

## 2020-05-23 NOTE — Progress Notes (Signed)
Subjective:    Patient ID: Kim Patterson, female    DOB: 2000-07-01, 20 y.o.   MRN: [id number]  Kim Patterson is a 20 y.o. female presenting on 05/23/2020 for ADHD  Previous PCP Kim Rankin, FNP   HPI   FOLLOW-UP ADHD - Last visit with PCP 08/2019, for same problem refills, treated with Adderall, see prior notes for background information. - Interval update with doing well on current medications - Today patient reports no new concerns, due for refill, out of Adderall, last filled 02/2020 - Improved attention and focus on medication still - Continues with class at [facility] and working - Patient notes no increased HR, tremor, or difficulty sleeping with current Adderall dose.  Occasionally skips a dose on weekend.     Depression screen [facility] 2/9 09/14/2019 02/09/2018  Decreased Interest 0 0  Down, Depressed, Hopeless 0 0  PHQ - 2 Score 0 0    Social History   Tobacco Use  . Smoking status: Never Smoker  . Smokeless tobacco: Never Used  Vaping Use  . Vaping Use: Never used  Substance Use Topics  . Alcohol use: No  . Drug use: No    Review of Systems Per HPI unless specifically indicated above     Objective:    BP 136/77   Pulse 71   Ht 5\' 7"  (1.702 m)   Wt 191 lb 6.4 oz (86.8 kg)   SpO2 100%   BMI 29.98 kg/m   Wt Readings from Last 3 Encounters:  05/23/20 191 lb 6.4 oz (86.8 kg)  09/14/19 203 lb 9.6 oz (92.4 kg) (98 %, Z= 1.99)*  03/12/18 154 lb 6.4 oz (70 kg) (86 %, Z= 1.09)*   * Growth percentiles are based on CDC (Girls, 2-20 Years) data.    Physical Exam Vitals and nursing note reviewed.  Constitutional:      General: She is not in acute distress.    Appearance: She is well-developed and well-nourished. She is not diaphoretic.     Comments: Well-appearing, comfortable, cooperative  HENT:     Head: Normocephalic and atraumatic.     Mouth/Throat:     Mouth: Oropharynx is clear and moist.  Eyes:     General:        Right eye: No discharge.        Left eye:  No discharge.     Conjunctiva/sclera: Conjunctivae normal.  Cardiovascular:     Rate and Rhythm: Normal rate.  Pulmonary:     Effort: Pulmonary effort is normal.  Musculoskeletal:        General: No edema.  Skin:    General: Skin is warm and dry.     Findings: No erythema or rash.  Neurological:     Mental Status: She is alert and oriented to person, place, and time.  Psychiatric:        Mood and Affect: Mood and affect normal.        Behavior: Behavior normal.     Comments: Well groomed, good eye contact, normal speech and thoughts    Results for orders placed or performed during the hospital encounter of 12/14/19  POCT urine pregnancy  Result Value Ref Range   Preg Test, Ur Negative Negative      Assessment & Plan:   Problem List Items Addressed This Visit    Attention deficit hyperactivity disorder (ADHD), predominantly inattentive type   Relevant Medications   amphetamine-dextroamphetamine (ADDERALL) 10 MG tablet (Start on 07/22/2020)   amphetamine-dextroamphetamine (  ADDERALL) 10 MG tablet (Start on 06/22/2020)   amphetamine-dextroamphetamine (ADDERALL) 10 MG tablet      Controlled ADHD on Adderall current dosing Refill x 3 separate rx Adderall 10mg  tabs #30 0 refill, fill dates 3/2, 4/1, 5/1 Notify office before out of 3rd rx and we can re order 3 more separate rx for 3 more months if doing well and stable Next follow-up in 6 months w/ new provider   Meds ordered this encounter  Medications  . amphetamine-dextroamphetamine (ADDERALL) 10 MG tablet    Sig: Take 1 tablet (10 mg total) by mouth daily with breakfast.    Dispense:  30 tablet    Refill:  0    First fill 07/22/20  . amphetamine-dextroamphetamine (ADDERALL) 10 MG tablet    Sig: Take 1 tablet (10 mg total) by mouth daily with breakfast.    Dispense:  30 tablet    Refill:  0    First Fill 06/22/20  . amphetamine-dextroamphetamine (ADDERALL) 10 MG tablet    Sig: Take 1 tablet (10 mg total) by mouth daily with  breakfast.    Dispense:  30 tablet    Refill:  0    First fill date 05/23/20      Follow up plan: Return in about 6 months (around 11/23/2020) for 6 month follow-up with new provider, ADHD refills.   01/23/2021, DO [facility] 05/23/2020, 4:13 PM

## 2020-05-23 NOTE — Patient Instructions (Addendum)
Thank you for coming to the office today.  Adderall 10mg  daily ordered for #30 pills with 0 refills, x 3 total separate rx. First fill dates 3/2, 4/1, 5/1  Before 08/22/20 - about 1 full week before you run out of 3rd rx, please contact 10/22/20 directly to request another 3 month refill on this medication. I should be able to authorize it for 3 more separate rx for you, and then would need to return to clinic to follow-up in about 6 months from today  Please schedule a Follow-up Appointment to: Return in about 6 months (around 11/23/2020) for 6 month follow-up with new provider, ADHD refills.  If you have any other questions or concerns, please feel free to call the office or send a message through MyChart. You may also schedule an earlier appointment if necessary.  Additionally, you may be receiving a survey about your experience at our office within a few days to 1 week by e-mail or mail. We value your feedback.  01/23/2021, DO [facility], [facility]

## 2020-10-17 ENCOUNTER — Other Ambulatory Visit: Payer: Self-pay | Admitting: Family Medicine

## 2020-10-17 DIAGNOSIS — F900 Attention-deficit hyperactivity disorder, predominantly inattentive type: Secondary | ICD-10-CM

## 2020-10-17 NOTE — Telephone Encounter (Signed)
Requested medications are due for refill today.  yes  Requested medications are on the active medications list.  yes  Last refill. 07/22/2020  Future visit scheduled.   no  Notes to clinic.  Medication not delegated. Pt is under [employer].

## 2020-12-10 ENCOUNTER — Other Ambulatory Visit: Payer: Self-pay | Admitting: Internal Medicine

## 2020-12-10 NOTE — Telephone Encounter (Signed)
Requested medications are due for refill today.  yes  Requested medications are on the active medications list.  yes  Last refill. 10/18/2020  Future visit scheduled.   no  Notes to clinic.  Medication not delegated. PCP listed as Danielle Rankin

## 2021-12-05 ENCOUNTER — Ambulatory Visit: Payer: Self-pay | Admitting: Internal Medicine

## 2021-12-05 ENCOUNTER — Encounter: Payer: Self-pay | Admitting: Internal Medicine

## 2021-12-05 VITALS — BP 132/78 | HR 82 | Temp 96.9°F | Ht 67.0 in | Wt 202.0 lb

## 2021-12-05 DIAGNOSIS — Z79899 Other long term (current) drug therapy: Secondary | ICD-10-CM

## 2021-12-05 DIAGNOSIS — F900 Attention-deficit hyperactivity disorder, predominantly inattentive type: Secondary | ICD-10-CM

## 2021-12-05 DIAGNOSIS — F411 Generalized anxiety disorder: Secondary | ICD-10-CM | POA: Insufficient documentation

## 2021-12-05 DIAGNOSIS — E6609 Other obesity due to excess calories: Secondary | ICD-10-CM | POA: Insufficient documentation

## 2021-12-05 DIAGNOSIS — Z6831 Body mass index (BMI) 31.0-31.9, adult: Secondary | ICD-10-CM

## 2021-12-05 DIAGNOSIS — D751 Secondary polycythemia: Secondary | ICD-10-CM

## 2021-12-05 MED ORDER — AMPHETAMINE-DEXTROAMPHETAMINE 10 MG PO TABS: 10.0000 mg | ORAL_TABLET | Freq: Every day | ORAL | 0 refills | Status: DC

## 2021-12-05 NOTE — Assessment & Plan Note (Signed)
Encourage diet and exercise for weight loss 

## 2021-12-05 NOTE — Progress Notes (Signed)
Subjective:    Patient ID: Kim Patterson, female    DOB: 07/31/2000, 21 y.o.   MRN: [id number]  HPI  Patient presents to clinic today for follow-up of chronic conditions.  ADHD: She has not been taking Adderall as prescribed. She does feel like she needs to get restarted on this. She reports mainly inattention and difficulty with memory. 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 She does not follow with psychiatry.  GAD: Persistent, but she is not currently taking any medication for this. She is not currently seeing a therapist. She denies SI/HI.  Polycythemia/Thrombocytosis: Her last H/H was 16.1/48.9, platelet count 509, 2019.  She does not follow with hematology. She no longer smokes.  Review of Systems  Past Medical History:  Diagnosis Date   ADHD (attention deficit hyperactivity disorder)     Current Outpatient Medications  Medication Sig Dispense Refill   albuterol (VENTOLIN HFA) 108 (90 Base) MCG/ACT inhaler Inhale 2 puffs into the lungs every 4 (four) hours as needed for wheezing or shortness of breath. 18 g 0   amphetamine-dextroamphetamine (ADDERALL) 10 MG tablet Take 1 tablet (10 mg total) by mouth daily with breakfast. 30 tablet 0   amphetamine-dextroamphetamine (ADDERALL) 10 MG tablet Take 1 tablet (10 mg total) by mouth daily with breakfast. 30 tablet 0   amphetamine-dextroamphetamine (ADDERALL) 10 MG tablet TAKE 1 TABLET BY MOUTH DAILY WITH BREAKFAST 30 tablet 0   amphetamine-dextroamphetamine (ADDERALL) 10 MG tablet TAKE 1 TABLET BY MOUTH DAILY WITH BREAKFAST 30 tablet 0   BLISOVI FE 1/20 1-20 MG-MCG tablet TAKE 1 TABLET BY MOUTH DAILY 28 tablet 2   No current facility-administered medications for this visit.    No Known Allergies  Family History  Problem Relation Age of Onset   Healthy Mother    Hypertension Father    Healthy Sister    Irritable bowel syndrome Maternal Aunt    Irritable bowel syndrome Maternal Grandmother     Hypertension Paternal Grandmother    Endometriosis Maternal Aunt    Stroke Neg Hx    Heart disease Neg Hx    Breast cancer Neg Hx    Ovarian cancer Neg Hx    Colon cancer Neg Hx     Social History   Socioeconomic History   Marital status: Single    Spouse name: Not on file   Number of children: Not on file   Years of education: Not on file   Highest education level: Not on file  Occupational History   Not on file  Tobacco Use   Smoking status: Never   Smokeless tobacco: Never  Vaping Use   Vaping Use: Never used  Substance and Sexual Activity   Alcohol use: No   Drug use: No   Sexual activity: Not on file  Other Topics Concern   Not on file  Social History Narrative   Not on file   Social Determinants of Health   Financial Resource Strain: Not on file  Food Insecurity: Not on file  Transportation Needs: Not on file  Physical Activity: Not on file  Stress: Not on file  Social Connections: Not on file  Intimate Partner Violence: Not on file     Constitutional: Denies fever, malaise, fatigue, headache or abrupt weight changes.  HEENT: Denies eye pain, eye redness, ear pain, ringing in the ears, wax buildup, runny nose, nasal congestion, bloody nose, or sore throat. Respiratory: Denies difficulty breathing, shortness of breath, cough or sputum production.  Cardiovascular: Denies chest pain, chest tightness, palpitations or swelling in the hands or feet.  Gastrointestinal: Denies abdominal pain, bloating, constipation, diarrhea or blood in the stool.  GU: Denies urgency, frequency, pain with urination, burning sensation, blood in urine, odor or discharge. Musculoskeletal: Denies decrease in range of motion, difficulty with gait, muscle pain or joint pain and swelling.  Skin: Denies redness, rashes, lesions or ulcercations.  Neurological: Patient reports inattention, difficulty with memory.  Denies dizziness, difficulty with speech or problems with balance and  coordination.  Psych: Patient has a history of anxiety.  Denies depression, SI/HI.  No other specific complaints in a complete review of systems (except as listed in HPI above).     Objective:   Physical Exam  BP 132/78 (BP Location: Left Arm, Patient Position: Sitting, Cuff Size: Normal)   Pulse 82   Temp (!) 96.9 F (36.1 C) (Temporal)   Ht 5\' 7"  (1.702 m)   Wt 202 lb (91.6 kg)   SpO2 99%   BMI 31.64 kg/m   Wt Readings from Last 3 Encounters:  05/23/20 191 lb 6.4 oz (86.8 kg)  09/14/19 203 lb 9.6 oz (92.4 kg) (98 %, Z= 1.99)*  03/12/18 154 lb 6.4 oz (70 kg) (86 %, Z= 1.09)*   * Growth percentiles are based on CDC (Girls, 2-20 Years) data.    General: Appears her stated age, obese, in NAD. Cardiovascular: Normal rate and rhythm. S1,S2 noted.  No murmur, rubs or gallops noted.  Pulmonary/Chest: Normal effort and positive vesicular breath sounds. No respiratory distress. No wheezes, rales or ronchi noted.  Neurological: Alert and oriented.  Psychiatric: Mood and affect normal.  Anxious appearing. Judgment and thought content normal.     BMET    Component Value Date/Time   NA 137 02/09/2018 1206   K 5.0 02/09/2018 1206   CL 101 02/09/2018 1206   CO2 28 02/09/2018 1206   GLUCOSE 104 (H) 02/09/2018 1206   BUN 9 02/09/2018 1206   CREATININE 0.84 02/09/2018 1206   CALCIUM 10.6 (H) 02/09/2018 1206   GFRNONAA 101 02/09/2018 1206   GFRAA 118 02/09/2018 1206    Lipid Panel  No results found for: "CHOL", "TRIG", "HDL", "CHOLHDL", "VLDL", "LDLCALC"  CBC    Component Value Date/Time   WBC 14.4 (H) 02/09/2018 1206   RBC 5.13 (H) 02/09/2018 1206   HGB 16.0 (H) 02/09/2018 1206   HCT 48.1 (H) 02/09/2018 1206   PLT 509 (H) 02/09/2018 1206   MCV 93.8 02/09/2018 1206   MCH 31.2 02/09/2018 1206   MCHC 33.3 02/09/2018 1206   RDW 11.7 02/09/2018 1206   LYMPHSABS 3,456 02/09/2018 1206   EOSABS 1,008 (H) 02/09/2018 1206   BASOSABS 43 02/09/2018 1206    Hgb A1C No results  found for: "HGBA1C"         Assessment & Plan:    RTC in 1 year for your annual exam 02/11/2018, NP

## 2021-12-05 NOTE — Assessment & Plan Note (Signed)
CBC today.  

## 2021-12-05 NOTE — Patient Instructions (Signed)
Attention Deficit Hyperactivity Disorder, Adult Attention deficit hyperactivity disorder (ADHD) is a mental health disorder that starts during childhood. For many people with ADHD, the disorder continues into the adult years. Treatment can help you manage your symptoms. There are three main types of ADHD: Inattentive. With this type, adults have difficulty paying attention. This may affect cognitive abilities. Hyperactive-impulsive. With this type, adults have a lot of energy and have difficulty controlling their behavior. Combination type. Some people may have symptoms of both types. What are the causes? The exact cause of ADHD is not known. Most experts believe a person's genes and environment possibly contribute to ADHD. What increases the risk? The following factors may make you more likely to develop this condition: Having a first-degree relative such as a parent, brother, or sister, with the condition. Being born before 37 weeks of pregnancy (prematurely) or at a low birth weight. Being born to a mother who smoked tobacco or drank alcohol during pregnancy. Having experienced a brain injury. Being exposed to lead or other toxins in the womb or early in life. What are the signs or symptoms? Symptoms of this condition depend on the type of ADHD. Symptoms of the inattentive type include: Difficulty paying attention or following instructions. Often making simple mistakes. Being disorganized. Avoiding tasks that require time and attention. Losing and forgetting things. Symptoms of the hyperactive-impulsive type include: Restlessness. Talking out of turn, interrupting others, or talking too much. Difficulty with: Sitting still. Feeling motivated. Relaxing. Waiting in line or waiting for a turn. People with the combination type have symptoms of both of the other types. In adults, this condition may lead to certain problems, such as: Keeping jobs. Performing tasks at work. Having  stable relationships. Being on time or keeping to a schedule. How is this diagnosed? This condition is diagnosed based on your current symptoms and your history of symptoms. The diagnosis can be made by a health care provider such as a primary care provider or a mental health care specialist. Your health care provider may use a symptom checklist or a behavior rating scale to evaluate your symptoms. Your health care provider may also want to talk with people who have observed your behaviors throughout your life. How is this treated? This condition can be treated with medicines and behavior therapy. Medicines may be the best option to reduce impulsive behaviors and improve attention. Your health care provider may recommend: Stimulant medicines. These are the most common medicines used for adult ADHD. They affect certain chemicals in the brain (neurotransmitters) and improve your ability to control your symptoms. A non-stimulant medicine. These medicines can also improve focus, attention, and impulsive behavior. It may take weeks to months to see the effects of this medicine. Counseling and behavioral management are also important for treating ADHD. Counseling is often used along with medicine. Your health care provider may suggest: Cognitive behavioral therapy (CBT). This type of therapy teaches you to replace negative thoughts and actions with positive thoughts and actions. When used as part of ADHD treatment, this therapy may also include: Coping strategies for organization, time management, impulse control, and stress reduction. Mindfulness and meditation training. Behavioral management. You may work with a coach who is specially trained to help people with ADHD manage and organize activities and function more effectively. Follow these instructions at home: Medicines  Take over-the-counter and prescription medicines only as told by your health care provider. Talk with your health care provider  about the possible side effects of your medicines and   how to manage them. Alcohol use Do not drink alcohol if: Your health care provider tells you not to drink. You are pregnant, may be pregnant, or are planning to become pregnant. If you drink alcohol: Limit how much you use to: 0-1 drink a day for women. 0-2 drinks a day for men. Know how much alcohol is in your drink. In the [place] one drink equals one 12 oz bottle of beer (355 mL), one 5 oz glass of wine (148 mL), or one 1 oz glass of hard liquor (44 mL). Lifestyle  Do not use illegal drugs. Get enough sleep. Eat a healthy diet. Exercise regularly. Exercise can help to reduce stress and anxiety. General instructions Learn as much as you can about adult ADHD, and work closely with your health care providers to find the treatments that work best for you. Follow the same schedule each day. Use reminder devices like notes, calendars, and phone apps to stay on time and organized. Keep all follow-up visits. Your health care provider will need to monitor your condition and adjust your treatment over time. Where to find more information A health care provider may be able to recommend resources that are available online or over the phone. You could start with: Attention Deficit Disorder Association (ADDA): add.org National Institute of Mental Health (NIMH): nimh.nih.gov Contact a health care provider if: Your symptoms continue to cause problems. You have side effects from your medicine, such as: Repeated muscle twitches, coughing, or speech outbursts. Sleep problems. Loss of appetite. Dizziness. Unusually fast heartbeat. Stomach pains. Headaches. You are struggling with anxiety, depression, or substance abuse. Get help right away if: You have a severe reaction to a medicine. This symptom may be an emergency. Get help right away. Call 911. Do not wait to see if the symptom will go away. Do not drive yourself to the hospital. Take  one of these steps if you feel like you may hurt yourself or others, or have thoughts about taking your own life: Go to your nearest emergency room. Call 911. Call the National Suicide Prevention Lifeline at 1-800-273-8255 or 988. This is open 24 hours a day Text the Crisis Text Line at 741741. Summary ADHD is a mental health disorder that starts during childhood and often continues into your adult years. The exact cause of ADHD is not known. Most experts believe genetics and environmental factors contribute to ADHD. There is no cure for ADHD, but treatment with medicine, cognitive behavioral therapy, or behavioral management can help you manage your condition. This information is not intended to replace advice given to you by your health care provider. Make sure you discuss any questions you have with your health care provider. Document Revised: 06/28/2021 Document Reviewed: 06/28/2021 Elsevier Patient Education  2023 Elsevier Inc.  

## 2021-12-05 NOTE — Assessment & Plan Note (Signed)
CSA and UDS today Adderall refilled

## 2021-12-05 NOTE — Assessment & Plan Note (Signed)
PHQ-9 score 15, GAD 7 score of 13 She is not interested in referral to psychology for CBT at this time due to financial limitations She is not interested in medication therapy at this time Support offered

## 2021-12-07 LAB — CBC
HCT: 47 % — ABNORMAL HIGH (ref 35.0–45.0)
Hemoglobin: 15.2 g/dL (ref 11.7–15.5)
MCH: 30.3 pg (ref 27.0–33.0)
MCHC: 32.3 g/dL (ref 32.0–36.0)
MCV: 93.6 fL (ref 80.0–100.0)
MPV: 9.9 fL (ref 7.5–12.5)
Platelets: 548 10*3/uL — ABNORMAL HIGH (ref 140–400)
RBC: 5.02 10*6/uL (ref 3.80–5.10)
RDW: 12.3 % (ref 11.0–15.0)
WBC: 8.5 10*3/uL (ref 3.8–10.8)

## 2021-12-07 LAB — DRUG MONITORING, PANEL 8 WITH CONFIRMATION, URINE
6 Acetylmorphine: NEGATIVE ng/mL (ref <10)
Alcohol Metabolites: NEGATIVE ng/mL (ref <500)
Amphetamines: NEGATIVE ng/mL (ref <500)
Benzodiazepines: NEGATIVE ng/mL (ref <100)
Buprenorphine, Urine: NEGATIVE ng/mL (ref <5)
Cocaine Metabolite: NEGATIVE ng/mL (ref <150)
Creatinine: 141 mg/dL (ref >=20.0)
MDMA: NEGATIVE ng/mL (ref <500)
Marijuana Metabolite: 121 ng/mL — ABNORMAL HIGH (ref <5)
Marijuana Metabolite: POSITIVE ng/mL — AB (ref <20)
Opiates: NEGATIVE ng/mL (ref <100)
Oxidant: NEGATIVE ug/mL (ref <200)
Oxycodone: NEGATIVE ng/mL (ref <100)
pH: 5.6 (ref 4.5–9.0)

## 2021-12-07 LAB — DM TEMPLATE

## 2021-12-31 ENCOUNTER — Other Ambulatory Visit: Payer: Self-pay | Admitting: Internal Medicine

## 2021-12-31 DIAGNOSIS — F900 Attention-deficit hyperactivity disorder, predominantly inattentive type: Secondary | ICD-10-CM

## 2021-12-31 NOTE — Telephone Encounter (Signed)
Requested medication (s) are due for refill today - yes  Requested medication (s) are on the active medication list -yes  Future visit scheduled -no  Last refill: 12/05/21 #30  Notes to clinic: non delegated Rx  Requested Prescriptions  Pending Prescriptions Disp Refills   amphetamine-dextroamphetamine (ADDERALL) 10 MG tablet [Pharmacy Med Name: AMPHETAMINE-DEXTROAMPHETAMINE 10 MG] 30 tablet     Sig: TAKE ONE TABLET BY MOUTH DAILY WITH BREAKFAST     Not Delegated - Psychiatry:  Stimulants/ADHD Failed - 12/31/2021  9:34 AM      Failed - This refill cannot be delegated      Failed - Urine Drug Screen completed in last 360 days      Passed - Last BP in normal range    BP Readings from Last 1 Encounters:  12/05/21 132/78         Passed - Last Heart Rate in normal range    Pulse Readings from Last 1 Encounters:  12/05/21 82         Passed - Valid encounter within last 6 months    Recent Outpatient Visits           3 weeks ago [facility] [place], Coralie Keens, NP   1 year ago Attention deficit hyperactivity disorder (ADHD), predominantly inattentive type   [place], DO   2 years ago Attention deficit hyperactivity disorder (ADHD), predominantly inattentive type   [facility], Lupita Raider, FNP   3 years ago Attention deficit hyperactivity disorder (ADHD), predominantly inattentive type   [place], DO   3 years ago Attention deficit hyperactivity disorder (ADHD), predominantly inattentive type   [place], Jerrel Ivory, NP                 Requested Prescriptions  Pending Prescriptions Disp Refills   amphetamine-dextroamphetamine (ADDERALL) 10 MG tablet [Pharmacy Med Name: AMPHETAMINE-DEXTROAMPHETAMINE 10 MG] 30 tablet     Sig: TAKE ONE TABLET BY MOUTH DAILY WITH BREAKFAST     Not Delegated - Psychiatry:   Stimulants/ADHD Failed - 12/31/2021  9:34 AM      Failed - This refill cannot be delegated      Failed - Urine Drug Screen completed in last 360 days      Passed - Last BP in normal range    BP Readings from Last 1 Encounters:  12/05/21 132/78         Passed - Last Heart Rate in normal range    Pulse Readings from Last 1 Encounters:  12/05/21 82         Passed - Valid encounter within last 6 months    Recent Outpatient Visits           3 weeks ago [facility] [place], Coralie Keens, NP   1 year ago Attention deficit hyperactivity disorder (ADHD), predominantly inattentive type   [place], DO   2 years ago Attention deficit hyperactivity disorder (ADHD), predominantly inattentive type   [place], FNP   3 years ago Attention deficit hyperactivity disorder (ADHD), predominantly inattentive type   [place], DO   3 years ago Attention deficit hyperactivity disorder (ADHD), predominantly inattentive type   [facility] Merrilyn Puma, Jerrel Ivory, NP

## 2022-01-03 ENCOUNTER — Other Ambulatory Visit: Payer: Self-pay | Admitting: Internal Medicine

## 2022-01-03 DIAGNOSIS — F900 Attention-deficit hyperactivity disorder, predominantly inattentive type: Secondary | ICD-10-CM

## 2022-01-03 NOTE — Telephone Encounter (Signed)
Called pharmacy regarding Rx request. Receipt confirmed by pharmacy on 12/31/21 at 10:43 am. Medication has been filled today waiting on patient to pick up.

## 2022-01-03 NOTE — Telephone Encounter (Signed)
Requested medication (s) are due for refill today: duplicate request from pharmacy   Requested medication (s) are on the active medication list: yes   Last refill:  12/31/21 #30 0 refills  Future visit scheduled: no   Notes to clinic:  not delegated per protocol. Called pharmacy regarding Rx request. Receipt confirmed by pharmacy on 12/31/21 at 10:43 am. Medication has been filled today waiting on patient to pick up.  Duplicate request from pharmacy.     Requested Prescriptions  Pending Prescriptions Disp Refills   amphetamine-dextroamphetamine (ADDERALL) 10 MG tablet [Pharmacy Med Name: AMPHETAMINE-DEXTROAMPHETAMINE 10 MG] 30 tablet     Sig: TAKE ONE TABLET BY MOUTH DAILY WITH BREAKFAST     Not Delegated - Psychiatry:  Stimulants/ADHD Failed - 01/03/2022  9:06 AM      Failed - This refill cannot be delegated      Failed - Urine Drug Screen completed in last 360 days      Passed - Last BP in normal range    BP Readings from Last 1 Encounters:  12/05/21 132/78         Passed - Last Heart Rate in normal range    Pulse Readings from Last 1 Encounters:  12/05/21 82         Passed - Valid encounter within last 6 months    Recent Outpatient Visits           4 weeks ago [facility] [place], Coralie Keens, NP   1 year ago Attention deficit hyperactivity disorder (ADHD), predominantly inattentive type   [place], DO   2 years ago Attention deficit hyperactivity disorder (ADHD), predominantly inattentive type   [place], FNP   3 years ago Attention deficit hyperactivity disorder (ADHD), predominantly inattentive type   [place], DO   3 years ago Attention deficit hyperactivity disorder (ADHD), predominantly inattentive type   [facility] Merrilyn Puma, Jerrel Ivory, NP

## 2022-02-03 ENCOUNTER — Other Ambulatory Visit: Payer: Self-pay | Admitting: Internal Medicine

## 2022-02-03 DIAGNOSIS — F900 Attention-deficit hyperactivity disorder, predominantly inattentive type: Secondary | ICD-10-CM

## 2022-02-04 NOTE — Telephone Encounter (Signed)
Requested medications are due for refill today.  yes  Requested medications are on the active medications list.  yes  Last refill. 12/31/2021 #30 0 rf  Future visit scheduled.   yes  Notes to clinic.  Refill not delegated.    Requested Prescriptions  Pending Prescriptions Disp Refills   amphetamine-dextroamphetamine (ADDERALL) 10 MG tablet [Pharmacy Med Name: AMPHETAMINE-DEXTROAMPHETAMINE 10 MG] 30 tablet     Sig: TAKE ONE TABLET BY MOUTH DAILY WITH BREAKFAST     Not Delegated - Psychiatry:  Stimulants/ADHD Failed - 02/03/2022 10:26 AM      Failed - This refill cannot be delegated      Failed - Urine Drug Screen completed in last 360 days      Passed - Last BP in normal range    BP Readings from Last 1 Encounters:  12/05/21 132/78         Passed - Last Heart Rate in normal range    Pulse Readings from Last 1 Encounters:  12/05/21 82         Passed - Valid encounter within last 6 months    Recent Outpatient Visits           2 months ago Polycythemia   [facility] [place], Salvadore Oxford, NP   1 year ago Attention deficit hyperactivity disorder (ADHD), predominantly inattentive type   [facility] [place], Netta Neat, DO   2 years ago Attention deficit hyperactivity disorder (ADHD), predominantly inattentive type   [facility], Jodelle Gross, FNP   3 years ago Attention deficit hyperactivity disorder (ADHD), predominantly inattentive type   [facility] Smitty Cords, DO   3 years ago Attention deficit hyperactivity disorder (ADHD), predominantly inattentive type   [facility] Kyung Rudd, Alison Stalling, NP       Future Appointments             In 2 days Sampson Si, Salvadore Oxford, NP [facility] [place], [facility]

## 2022-02-06 ENCOUNTER — Ambulatory Visit (INDEPENDENT_AMBULATORY_CARE_PROVIDER_SITE_OTHER): Payer: Self-pay | Admitting: Internal Medicine

## 2022-02-06 ENCOUNTER — Encounter: Payer: Self-pay | Admitting: Internal Medicine

## 2022-02-06 VITALS — BP 136/86 | HR 84 | Temp 96.6°F | Wt 195.0 lb

## 2022-02-06 DIAGNOSIS — F411 Generalized anxiety disorder: Secondary | ICD-10-CM

## 2022-02-06 MED ORDER — ESCITALOPRAM OXALATE 5 MG PO TABS: 5.0000 mg | ORAL_TABLET | Freq: Every day | ORAL | 0 refills | Status: DC

## 2022-02-06 NOTE — Progress Notes (Signed)
Subjective:    Patient ID: Kim Patterson, female    DOB: Aug 07, 2000, 21 y.o.   MRN: [id number]  HPI  Patient presents to clinic today for follow-up of anxiety.  She is not currently taking any medications for this and was not interested in medication management at our last visit 11/2021.  She had previously declined referral for therapy due to financial limitations. She reports worsening anxiety in the last few weeks, after the death of her boyfriends grandfather. She reports she worries all the time. She has trouble falling asleep.She feels like she is overeating but she has been losing weight. She feels symptoms more days that not. She denies depression, SI/HI.  Review of Systems  Past Medical History:  Diagnosis Date   ADHD (attention deficit hyperactivity disorder)     Current Outpatient Medications  Medication Sig Dispense Refill   albuterol (VENTOLIN HFA) 108 (90 Base) MCG/ACT inhaler Inhale 2 puffs into the lungs every 4 (four) hours as needed for wheezing or shortness of breath. 18 g 0   amphetamine-dextroamphetamine (ADDERALL) 10 MG tablet TAKE ONE TABLET BY MOUTH DAILY WITH BREAKFAST 30 tablet 0   No current facility-administered medications for this visit.    No Known Allergies  Family History  Problem Relation Age of Onset   Healthy Mother    Hypertension Father    Healthy Sister    Irritable bowel syndrome Maternal Aunt    Irritable bowel syndrome Maternal Grandmother    Hypertension Paternal Grandmother    Endometriosis Maternal Aunt    Stroke Neg Hx    Heart disease Neg Hx    Breast cancer Neg Hx    Ovarian cancer Neg Hx    Colon cancer Neg Hx     Social History   Socioeconomic History   Marital status: Single    Spouse name: Not on file   Number of children: Not on file   Years of education: Not on file   Highest education level: Not on file  Occupational History   Not on file  Tobacco Use   Smoking status: Never   Smokeless tobacco: Never  Vaping  Use   Vaping Use: Never used  Substance and Sexual Activity   Alcohol use: No   Drug use: No   Sexual activity: Not on file  Other Topics Concern   Not on file  Social History Narrative   Not on file   Social Determinants of Health   Financial Resource Strain: Not on file  Food Insecurity: Not on file  Transportation Needs: Not on file  Physical Activity: Not on file  Stress: Not on file  Social Connections: Not on file  Intimate Partner Violence: Not on file     Constitutional: Patient reports fatigue.  Denies fever, malaise, headache or abrupt weight changes.  Respiratory: Denies difficulty breathing, shortness of breath, cough or sputum production.   Cardiovascular: Denies chest pain, chest tightness, palpitations or swelling in the hands or feet.  Neurological: Patient reports inattention.  Denies dizziness, difficulty with memory, difficulty with speech or problems with balance and coordination.  Psych: Patient reports anxiety.  Denies depression, SI/HI.  No other specific complaints in a complete review of systems (except as listed in HPI above).     Objective:   Physical Exam   BP 136/86 (BP Location: Left Arm, Patient Position: Sitting, Cuff Size: Normal)   Pulse 84   Temp (!) 96.6 F (35.9 C) (Temporal)   Wt 195 lb (88.5 kg)  SpO2 99%   BMI 30.54 kg/m   Wt Readings from Last 3 Encounters:  12/05/21 202 lb (91.6 kg)  05/23/20 191 lb 6.4 oz (86.8 kg)  09/14/19 203 lb 9.6 oz (92.4 kg) (98 %, Z= 1.99)*   * Growth percentiles are based on CDC (Girls, 2-20 Years) data.    General: Appears her stated age, obese in NAD. Cardiovascular: Normal rate and rhythm.  Pulmonary/Chest: Normal effort and positive vesicular breath sounds. No respiratory distress. No wheezes, rales or ronchi noted.  Neurological: Alert and oriented.  Psychiatric: Mood and affect normal.  Mildly anxious appearing. Judgment and thought content normal.    BMET    Component Value  Date/Time   NA 137 02/09/2018 1206   K 5.0 02/09/2018 1206   CL 101 02/09/2018 1206   CO2 28 02/09/2018 1206   GLUCOSE 104 (H) 02/09/2018 1206   BUN 9 02/09/2018 1206   CREATININE 0.84 02/09/2018 1206   CALCIUM 10.6 (H) 02/09/2018 1206   GFRNONAA 101 02/09/2018 1206   GFRAA 118 02/09/2018 1206    Lipid Panel  No results found for: "CHOL", "TRIG", "HDL", "CHOLHDL", "VLDL", "LDLCALC"  CBC    Component Value Date/Time   WBC 8.5 12/05/2021 0817   RBC 5.02 12/05/2021 0817   HGB 15.2 12/05/2021 0817   HCT 47.0 (H) 12/05/2021 0817   PLT 548 (H) 12/05/2021 0817   MCV 93.6 12/05/2021 0817   MCH 30.3 12/05/2021 0817   MCHC 32.3 12/05/2021 0817   RDW 12.3 12/05/2021 0817   LYMPHSABS 3,456 02/09/2018 1206   EOSABS 1,008 (H) 02/09/2018 1206   BASOSABS 43 02/09/2018 1206    Hgb A1C No results found for: "HGBA1C"         Assessment & Plan:     RTC in 6 months for your annual exam Webb Silvers[place], NP

## 2022-02-06 NOTE — Patient Instructions (Signed)

## 2022-02-06 NOTE — Assessment & Plan Note (Signed)
PHQ-9 score of 19, GAD score of 13 Discussed medication management with SSRI, risk versus benefits and side effects We will trial escitalopram 5 mg daily Support offered

## 2022-03-13 ENCOUNTER — Other Ambulatory Visit: Payer: Self-pay | Admitting: Internal Medicine

## 2022-03-13 DIAGNOSIS — F900 Attention-deficit hyperactivity disorder, predominantly inattentive type: Secondary | ICD-10-CM

## 2022-03-14 NOTE — Telephone Encounter (Signed)
Requested medication (s) are due for refill today: yes  Requested medication (s) are on the active medication list: yes  Last refill:  02/04/22 # 30  Future visit scheduled: no  Notes to clinic:  Please review for refill. Refill not delegated per protocol.  To pharmacy: NEED REFILLS. HAS 3 LEFT     Requested Prescriptions  Pending Prescriptions Disp Refills   amphetamine-dextroamphetamine (ADDERALL) 10 MG tablet [Pharmacy Med Name: AMPHETAMINE-DEXTROAMPHETAMINE 10 MG] 30 tablet     Sig: TAKE ONE TABLET EACH MORNING WITH BREAKFAST     Not Delegated - Psychiatry:  Stimulants/ADHD Failed - 03/13/2022  6:58 PM      Failed - This refill cannot be delegated      Failed - Urine Drug Screen completed in last 360 days      Passed - Last BP in normal range    BP Readings from Last 1 Encounters:  02/06/22 136/86         Passed - Last Heart Rate in normal range    Pulse Readings from Last 1 Encounters:  02/06/22 84         Passed - Valid encounter within last 6 months    Recent Outpatient Visits           1 month ago GAD (generalized anxiety disorder)   [facility] [place], Salvadore Oxford, NP   3 months ago Polycythemia   [facility] [place], Salvadore Oxford, NP   1 year ago Attention deficit hyperactivity disorder (ADHD), predominantly inattentive type   [facility] Smitty Cords, DO   2 years ago Attention deficit hyperactivity disorder (ADHD), predominantly inattentive type   [facility], Jodelle Gross, FNP   3 years ago Attention deficit hyperactivity disorder (ADHD), predominantly inattentive type   [facility] [place], Netta Neat, DO

## 2022-04-15 ENCOUNTER — Other Ambulatory Visit: Payer: Self-pay | Admitting: Internal Medicine

## 2022-04-15 DIAGNOSIS — F900 Attention-deficit hyperactivity disorder, predominantly inattentive type: Secondary | ICD-10-CM

## 2022-04-15 NOTE — Telephone Encounter (Signed)
Requested medications are due for refill today.  Unsure  Requested medications are on the active medications list.  yes  Last refill. 03/14/2022 #30 0 rf  Future visit scheduled.   no  Notes to clinic.  Refill not delegated.    Requested Prescriptions  Pending Prescriptions Disp Refills   amphetamine-dextroamphetamine (ADDERALL) 10 MG tablet [Pharmacy Med Name: AMPHETAMINE-DEXTROAMPHETAMINE 10 MG] 30 tablet     Sig: TAKE ONE TABLET EACH MORNING WITH BREAKFAST     Not Delegated - Psychiatry:  Stimulants/ADHD Failed - 04/15/2022  1:36 PM      Failed - This refill cannot be delegated      Failed - Urine Drug Screen completed in last 360 days      Passed - Last BP in normal range    BP Readings from Last 1 Encounters:  02/06/22 136/86         Passed - Last Heart Rate in normal range    Pulse Readings from Last 1 Encounters:  02/06/22 84         Passed - Valid encounter within last 6 months    Recent Outpatient Visits           2 months ago GAD (generalized anxiety disorder)   [facility] [place], Coralie Keens, NP   4 months ago [facility] [place], Coralie Keens, NP   1 year ago Attention deficit hyperactivity disorder (ADHD), predominantly inattentive type   [place], DO   2 years ago Attention deficit hyperactivity disorder (ADHD), predominantly inattentive type   [place], FNP   3 years ago Attention deficit hyperactivity disorder (ADHD), predominantly inattentive type   [place], [place]

## 2022-05-07 ENCOUNTER — Other Ambulatory Visit: Payer: Self-pay | Admitting: Internal Medicine

## 2022-05-07 NOTE — Telephone Encounter (Signed)
Requested Prescriptions  Pending Prescriptions Disp Refills   escitalopram (LEXAPRO) 5 MG tablet [Pharmacy Med Name: ESCITALOPRAM OXALATE 5 MG TAB] 90 tablet 0    Sig: TAKE ONE TABLET BY MOUTH AT BEDTIME     Psychiatry:  Antidepressants - SSRI Passed - 05/07/2022  3:01 PM      Passed - Valid encounter within last 6 months    Recent Outpatient Visits           3 months ago GAD (generalized anxiety disorder)   [facility] [place], Coralie Keens, NP   5 months ago [facility] [place], Coralie Keens, NP   1 year ago Attention deficit hyperactivity disorder (ADHD), predominantly inattentive type   [place], DO   2 years ago Attention deficit hyperactivity disorder (ADHD), predominantly inattentive type   [place], FNP   3 years ago Attention deficit hyperactivity disorder (ADHD), predominantly inattentive type   [place], [place]

## 2022-05-08 ENCOUNTER — Other Ambulatory Visit: Payer: Self-pay | Admitting: Internal Medicine

## 2022-05-08 MED ORDER — ESCITALOPRAM OXALATE 5 MG PO TABS: 5.0000 mg | ORAL_TABLET | Freq: Every day | ORAL | 0 refills | Status: DC

## 2022-05-09 ENCOUNTER — Ambulatory Visit: Payer: Self-pay | Admitting: Internal Medicine

## 2022-05-09 ENCOUNTER — Encounter: Payer: Self-pay | Admitting: Internal Medicine

## 2022-05-09 VITALS — BP 112/74 | HR 91 | Temp 96.9°F | Wt 196.0 lb

## 2022-05-09 DIAGNOSIS — R110 Nausea: Secondary | ICD-10-CM

## 2022-05-09 DIAGNOSIS — B370 Candidal stomatitis: Secondary | ICD-10-CM

## 2022-05-09 DIAGNOSIS — R0789 Other chest pain: Secondary | ICD-10-CM

## 2022-05-09 DIAGNOSIS — J029 Acute pharyngitis, unspecified: Secondary | ICD-10-CM

## 2022-05-09 MED ORDER — NYSTATIN 100000 UNIT/ML MT SUSP: 5.0000 mL | Freq: Four times a day (QID) | OROMUCOSAL | 0 refills | Status: AC

## 2022-05-09 MED ORDER — NYSTATIN 100000 UNIT/ML MT SUSP: 5.0000 mL | Freq: Four times a day (QID) | OROMUCOSAL | 0 refills | Status: DC

## 2022-05-09 MED ORDER — ALBUTEROL SULFATE HFA 108 (90 BASE) MCG/ACT IN AERS: 2.0000 | INHALATION_SPRAY | RESPIRATORY_TRACT | 0 refills | Status: AC | PRN

## 2022-05-09 MED ORDER — OMEPRAZOLE 20 MG PO CPDR: 20.0000 mg | DELAYED_RELEASE_CAPSULE | Freq: Every day | ORAL | 0 refills | Status: AC

## 2022-05-09 NOTE — Patient Instructions (Signed)
Esophagitis  Esophagitis is inflammation of the esophagus. The esophagus is the tube that carries food from the mouth to the stomach. Esophagitis can cause soreness or pain in the esophagus. This condition can make it difficult and painful to swallow. What are the causes? Most causes of esophagitis are not serious. Common causes of this condition include: Gastroesophageal reflux disease (GERD). This is when stomach contents move back up into the esophagus (reflux). Repeated vomiting. An allergic reaction, especially caused by food allergies (eosinophilic esophagitis). Injury to the esophagus by swallowing large pills with or without water, or swallowing certain types of medicines. Swallowing harmful chemicals, such as household cleaning products. Drinking a lot of alcohol. An infection of the esophagus. This most often occurs in people who have a weakened immune system. Radiation or chemotherapy treatment for cancer. Certain diseases such as sarcoidosis, Crohn's disease, and scleroderma. What are the signs or symptoms? Symptoms of this condition include: Difficult or painful swallowing. Pain with swallowing acidic liquids, such as citrus juices. You may also have pain when you burp. Chest pain and difficulty breathing. Nausea and vomiting. Pain in the abdomen. Weight loss. Ulcers in the mouth and white patches in the mouth (candidiasis). Fever. Coughing up blood or vomiting blood. Stool that is black, tarry, or bright red. How is this diagnosed? This condition may be diagnosed based on your medical history and a physical exam. You may also have other tests, including: A test to examine your esophagus and stomach with a small flexible tube with a camera (endoscopy). A test that measures the acidity level in your esophagus. A test that measures how much pressure is on your esophagus. A barium swallow or modified barium swallow to show the shape, size, and functioning of your  esophagus. Allergy tests. How is this treated? Treatment for this condition depends on the cause of your esophagitis. In some cases, steroids or other medicines may be given to help relieve your symptoms or to treat the underlying cause of your condition. You may have to make some lifestyle changes, such as: Avoiding alcohol. Quitting any products that contain nicotine or tobacco. These products include cigarettes, chewing tobacco, and vaping devices, such as e-cigarettes. If you need help quitting, ask your health care provider. Changing your diet. Exercising. Changing your sleep habits and your sleep environment. Follow these instructions at home: Medicines Take over-the-counter and prescription medicines only as told by your health care provider. Do not take aspirin, ibuprofen, or other NSAIDs unless your health care provider told you to do so. If you have trouble taking pills: Use a pill splitter to decrease the size of the pill. This will decrease the chance of the pill getting stuck or injuring your esophagus. Drink water after you take a pill. Eating and drinking  Avoid foods and drinks that seem to make your symptoms worse. Follow a diet as recommended by your health care provider. This may involve avoiding foods and drinks such as: Coffee and tea, with or without caffeine. Drinks that contain alcohol. Energy drinks and sports drinks. Carbonated drinks or sodas. Chocolate and cocoa. Peppermint and mint flavorings. Garlic and onions. Horseradish. Spicy and acidic foods, including peppers, chili powder, curry powder, vinegar, hot sauces, and barbecue sauce. Citrus fruit juices and citrus fruits, such as oranges, lemons, and limes. Tomato-based foods, such as red sauce, chili, salsa, and pizza with red sauce. Fried and fatty foods, such as donuts, french fries, potato chips, and high-fat dressings. High-fat meats, such as hot dogs and fatty  cuts of red and white meats, such as  rib eye steak, sausage, ham, and bacon. High-fat dairy items, such as whole milk, butter, and cream cheese. Lifestyle Eat small, frequent meals instead of large meals. Avoid drinking large amounts of liquid with your meals. Avoid eating meals during the 2-3 hours before bedtime. Avoid lying down right after you eat. Do not exercise right after you eat. Do not use any products that contain nicotine or tobacco. These products include cigarettes, chewing tobacco, and vaping devices, such as e-cigarettes. If you need help quitting, ask your health care provider. General instructions  Pay attention to any changes in your symptoms. Let your health care provider know about them. Wear loose-fitting clothing. Do not wear anything tight around your waist that causes pressure on your abdomen. Raise (elevate) the head of your bed about 6 inches (15 cm). You may need to use a wedge to do this. Try relaxation strategies such as yoga, deep breathing, or meditation to manage stress. If you need help reducing stress, ask your health care provider. If you are overweight, reduce your weight to an amount that is healthy for you. Ask your health care provider for guidance about a safe weight loss goal. Keep all follow-up visits. This is important. Contact a health care provider if: You have new symptoms. You have unexplained weight loss. You have difficulty swallowing, or it hurts to swallow. You have wheezing or a cough that does not go away. Your symptoms do not improve with treatment. You have frequent heartburn for more than two weeks. Get help right away if: You have sudden severe pain in your arms, neck, jaw, teeth, or back. You suddenly feel sweaty, dizzy, or light-headed. You have chest pain or shortness of breath. You vomit and the vomit is green, yellow, or black, or it looks like blood or coffee grounds. Your stool is red, bloody, or black. You have a fever. You cannot swallow, drink, or  eat. These symptoms may represent a serious problem that is an emergency. Do not wait to see if the symptoms will go away. Get medical help right away. Call your local emergency services (911 in the [place] Do not drive yourself to the hospital. Summary Esophagitis is inflammation of the esophagus. Most causes of esophagitis are not serious. Follow your health care provider's instructions about eating and drinking. Contact a health care provider if you have new symptoms, have weight loss, or coughing that does not stop. Get help right away if you have severe pain in the arms, neck, jaw, teeth, or back, or if you have chest pain, shortness of breath, or fever. This information is not intended to replace advice given to you by your health care provider. Make sure you discuss any questions you have with your health care provider. Document Revised: 09/19/2019 Document Reviewed: 09/19/2019 Elsevier Patient Education  [place].

## 2022-05-09 NOTE — Progress Notes (Signed)
Subjective:    Patient ID: Kim Patterson, female    DOB: 01-19-2001, 22 y.o.   MRN: [id number]  HPI  Patient presents to clinic today with complaint of a runny nose, nasal congestion, sore throat cough and chest pain.  This started 1 week ago.  She is blowing intermittent green mucous out of her nose. She is having difficulty swallowing. The cough is non productive, and seems worse when she lays down. She reports the difficulty swallowing and chest pain occur after eating. She has had some nausea but denies vomiting, constipation or diarrhea. She dneies fever, chills or body aches. She has not tried anything OTC for this.  She has no history of reflux. She does have a history of anxiety managed on Escitalopram. She has had sick contacts with RSV.  Review of Systems     Past Medical History:  Diagnosis Date   ADHD (attention deficit hyperactivity disorder)     Current Outpatient Medications  Medication Sig Dispense Refill   albuterol (VENTOLIN HFA) 108 (90 Base) MCG/ACT inhaler Inhale 2 puffs into the lungs every 4 (four) hours as needed for wheezing or shortness of breath. 18 g 0   amphetamine-dextroamphetamine (ADDERALL) 10 MG tablet TAKE ONE TABLET EACH MORNING WITH BREAKFAST 30 tablet 0   escitalopram (LEXAPRO) 5 MG tablet Take 1 tablet (5 mg total) by mouth at bedtime. 90 tablet 0   No current facility-administered medications for this visit.    No Known Allergies  Family History  Problem Relation Age of Onset   Healthy Mother    Hypertension Father    Healthy Sister    Irritable bowel syndrome Maternal Aunt    Irritable bowel syndrome Maternal Grandmother    Hypertension Paternal Grandmother    Endometriosis Maternal Aunt    Stroke Neg Hx    Heart disease Neg Hx    Breast cancer Neg Hx    Ovarian cancer Neg Hx    Colon cancer Neg Hx     Social History   Socioeconomic History   Marital status: Single    Spouse name: Not on file   Number of children: Not on file    Years of education: Not on file   Highest education level: Not on file  Occupational History   Not on file  Tobacco Use   Smoking status: Never   Smokeless tobacco: Never  Vaping Use   Vaping Use: Never used  Substance and Sexual Activity   Alcohol use: No   Drug use: No   Sexual activity: Not on file  Other Topics Concern   Not on file  Social History Narrative   Not on file   Social Determinants of Health   Financial Resource Strain: Not on file  Food Insecurity: Not on file  Transportation Needs: Not on file  Physical Activity: Not on file  Stress: Not on file  Social Connections: Not on file  Intimate Partner Violence: Not on file     Constitutional: Denies fever, malaise, fatigue, headache or abrupt weight changes.  HEENT: Patient reports runny nose, nasal congestion and sore throat.  Denies eye pain, eye redness, ear pain, ringing in the ears, wax buildup, bloody nose. Respiratory: Pt reports cough. Denies difficulty breathing, shortness of breath, or sputum production.   Cardiovascular: Pt reports chest pain. Denies chest tightness, palpitations or swelling in the hands or feet.  Gastrointestinal: Pt reports nausea. Denies abdominal pain, bloating, constipation, diarrhea or blood in the stool.  Musculoskeletal: Denies decrease  in range of motion, difficulty with gait, muscle pain or joint pain and swelling.  Skin: Denies redness, rashes, lesions or ulcercations.    No other specific complaints in a complete review of systems (except as listed in HPI above).  Objective:   Physical Exam  BP 112/74 (BP Location: Left Arm, Patient Position: Sitting, Cuff Size: Large)   Pulse 91   Temp (!) 96.9 F (36.1 C) (Temporal)   Wt 196 lb (88.9 kg)   SpO2 99%   BMI 30.70 kg/m   Wt Readings from Last 3 Encounters:  02/06/22 195 lb (88.5 kg)  12/05/21 202 lb (91.6 kg)  05/23/20 191 lb 6.4 oz (86.8 kg)    General: Appears her stated age, obese, in NAD. Skin: Warm, dry  and intact. No rashes noted. HEENT: Head: normal shape and size, no sinus tenderness noted;  Throat/Mouth: Teeth present, mucosa erythematous and moist, white coating noted of tongue, no lesions or ulcerations noted.  Neck:  No adenopathy noted. Cardiovascular: Normal rate and rhythm. S1,S2 noted.  No murmur, rubs or gallops noted.  Pulmonary/Chest: Normal effort and positive vesicular breath sounds. No respiratory distress. No wheezes, rales or ronchi noted.  Abdomen: Soft and nontender. Normal bowel sounds.  Musculoskeletal: No difficulty with gait.  Neurological: Alert and oriented.    BMET    Component Value Date/Time   NA 137 02/09/2018 1206   K 5.0 02/09/2018 1206   CL 101 02/09/2018 1206   CO2 28 02/09/2018 1206   GLUCOSE 104 (H) 02/09/2018 1206   BUN 9 02/09/2018 1206   CREATININE 0.84 02/09/2018 1206   CALCIUM 10.6 (H) 02/09/2018 1206   GFRNONAA 101 02/09/2018 1206   GFRAA 118 02/09/2018 1206    Lipid Panel  No results found for: "CHOL", "TRIG", "HDL", "CHOLHDL", "VLDL", "LDLCALC"  CBC    Component Value Date/Time   WBC 8.5 12/05/2021 0817   RBC 5.02 12/05/2021 0817   HGB 15.2 12/05/2021 0817   HCT 47.0 (H) 12/05/2021 0817   PLT 548 (H) 12/05/2021 0817   MCV 93.6 12/05/2021 0817   MCH 30.3 12/05/2021 0817   MCHC 32.3 12/05/2021 0817   RDW 12.3 12/05/2021 0817   LYMPHSABS 3,456 02/09/2018 1206   EOSABS 1,008 (H) 02/09/2018 1206   BASOSABS 43 02/09/2018 1206    Hgb A1C No results found for: "HGBA1C"         Assessment & Plan:    Sore Throat, Nausea and Chest Pain:  DDx viral pharyngitis, bacterial pharyngitis, reflux, esophagitis, eosin esophagitis Rapid strep negative She has evidence of yeast on her tongue, will treat with nystatin swish and spit Rx for Omeprazole 20 mg daily in a.m. x 2 to 4 weeks If not pregnant, consider referral to GI for further evaluation of symptoms   RTC in 3 months for annual exam Webb Silversmith, NP

## 2022-05-20 ENCOUNTER — Encounter: Payer: Self-pay | Admitting: Internal Medicine

## 2022-08-04 ENCOUNTER — Other Ambulatory Visit: Payer: Self-pay | Admitting: Internal Medicine

## 2022-08-04 NOTE — Telephone Encounter (Signed)
Requested Prescriptions  Pending Prescriptions Disp Refills   escitalopram (LEXAPRO) 5 MG tablet [Pharmacy Med Name: ESCITALOPRAM OXALATE 5 MG TAB] 90 tablet 0    Sig: TAKE ONE TABLET BY MOUTH AT BEDTIME     Psychiatry:  Antidepressants - SSRI Passed - 08/04/2022  8:25 AM      Passed - Valid encounter within last 6 months    Recent Outpatient Visits           2 months ago Sore throat   [place] [facility] [place], Salvadore Oxford, NP   5 months ago GAD (generalized anxiety disorder)   [facility] [place], Salvadore Oxford, NP   8 months ago Polycythemia   [place] [facility] [place], Salvadore Oxford, NP   2 years ago Attention deficit hyperactivity disorder (ADHD), predominantly inattentive type   [facility] [place], DO   2 years ago Attention deficit hyperactivity disorder (ADHD), predominantly inattentive type   [facility], Jodelle Gross, [place]

## 2022-08-07 ENCOUNTER — Encounter: Payer: Self-pay | Admitting: Internal Medicine

## 2023-01-19 ENCOUNTER — Encounter: Payer: Self-pay | Admitting: Internal Medicine

## 2023-07-23 ENCOUNTER — Other Ambulatory Visit: Payer: Self-pay | Admitting: Internal Medicine

## 2023-07-23 DIAGNOSIS — F900 Attention-deficit hyperactivity disorder, predominantly inattentive type: Secondary | ICD-10-CM

## 2023-09-18 ENCOUNTER — Ambulatory Visit: Payer: Self-pay | Admitting: Internal Medicine
